# Patient Record
Sex: Female | Born: 1935
Health system: Southern US, Community
[De-identification: ages and names within clinical notes are randomized; demographics above are authoritative.]

## PROBLEM LIST (undated history)

## (undated) DIAGNOSIS — K219 Gastro-esophageal reflux disease without esophagitis: Secondary | ICD-10-CM

## (undated) DIAGNOSIS — C50919 Malignant neoplasm of unspecified site of unspecified female breast: Secondary | ICD-10-CM

## (undated) DIAGNOSIS — M2550 Pain in unspecified joint: Secondary | ICD-10-CM

## (undated) DIAGNOSIS — R61 Generalized hyperhidrosis: Secondary | ICD-10-CM

## (undated) DIAGNOSIS — E049 Nontoxic goiter, unspecified: Secondary | ICD-10-CM

## (undated) DIAGNOSIS — M810 Age-related osteoporosis without current pathological fracture: Secondary | ICD-10-CM

## (undated) DIAGNOSIS — M1990 Unspecified osteoarthritis, unspecified site: Secondary | ICD-10-CM

## (undated) DIAGNOSIS — R238 Other skin changes: Secondary | ICD-10-CM

## (undated) DIAGNOSIS — B029 Zoster without complications: Secondary | ICD-10-CM

## (undated) DIAGNOSIS — C801 Malignant (primary) neoplasm, unspecified: Secondary | ICD-10-CM

## (undated) DIAGNOSIS — C539 Malignant neoplasm of cervix uteri, unspecified: Secondary | ICD-10-CM

## (undated) DIAGNOSIS — R635 Abnormal weight gain: Secondary | ICD-10-CM

## (undated) DIAGNOSIS — M797 Fibromyalgia: Secondary | ICD-10-CM

## (undated) HISTORY — PX: SPINE SURGERY: SHX786

## (undated) HISTORY — DX: Abnormal weight gain: R63.5

## (undated) HISTORY — DX: Malignant neoplasm of cervix uteri, unspecified: C53.9

## (undated) HISTORY — DX: Zoster without complications: B02.9

## (undated) HISTORY — DX: Age-related osteoporosis without current pathological fracture: M81.0

## (undated) HISTORY — PX: CERVICAL CONE BIOPSY: SUR198

## (undated) HISTORY — DX: Malignant neoplasm of unspecified site of unspecified female breast: C50.919

## (undated) HISTORY — DX: Other skin changes: R23.8

## (undated) HISTORY — DX: Gastro-esophageal reflux disease without esophagitis: K21.9

## (undated) HISTORY — DX: Pain in unspecified joint: M25.50

## (undated) HISTORY — DX: Generalized hyperhidrosis: R61

## (undated) HISTORY — DX: Unspecified osteoarthritis, unspecified site: M19.90

## (undated) HISTORY — DX: Nontoxic goiter, unspecified: E04.9

## (undated) HISTORY — PX: UTERINE SUSPENSION: SUR1430

## (undated) HISTORY — DX: Fibromyalgia: M79.7

## (undated) HISTORY — DX: Malignant (primary) neoplasm, unspecified: C80.1

---

## 1998-03-26 ENCOUNTER — Ambulatory Visit (HOSPITAL_COMMUNITY): Admission: RE | Admit: 1998-03-26 | Discharge: 1998-03-27 | Payer: Self-pay | Admitting: Neurosurgery

## 1998-06-17 ENCOUNTER — Ambulatory Visit (HOSPITAL_COMMUNITY): Admission: RE | Admit: 1998-06-17 | Discharge: 1998-06-17 | Payer: Self-pay | Admitting: Urology

## 1998-09-13 ENCOUNTER — Other Ambulatory Visit: Admission: RE | Admit: 1998-09-13 | Discharge: 1998-09-13 | Payer: Self-pay | Admitting: Obstetrics and Gynecology

## 1998-10-21 ENCOUNTER — Ambulatory Visit (HOSPITAL_COMMUNITY): Admission: RE | Admit: 1998-10-21 | Discharge: 1998-10-21 | Payer: Self-pay | Admitting: Neurosurgery

## 1998-10-21 ENCOUNTER — Encounter: Payer: Self-pay | Admitting: Neurosurgery

## 1999-09-14 ENCOUNTER — Other Ambulatory Visit: Admission: RE | Admit: 1999-09-14 | Discharge: 1999-09-14 | Payer: Self-pay | Admitting: Obstetrics and Gynecology

## 2000-10-15 ENCOUNTER — Other Ambulatory Visit: Admission: RE | Admit: 2000-10-15 | Discharge: 2000-10-15 | Payer: Self-pay | Admitting: Obstetrics and Gynecology

## 2000-12-31 ENCOUNTER — Ambulatory Visit (HOSPITAL_COMMUNITY): Admission: RE | Admit: 2000-12-31 | Discharge: 2000-12-31 | Payer: Self-pay

## 2001-12-10 ENCOUNTER — Other Ambulatory Visit: Admission: RE | Admit: 2001-12-10 | Discharge: 2001-12-10 | Payer: Self-pay | Admitting: *Deleted

## 2002-12-23 ENCOUNTER — Other Ambulatory Visit: Admission: RE | Admit: 2002-12-23 | Discharge: 2002-12-23 | Payer: Self-pay | Admitting: *Deleted

## 2003-12-31 ENCOUNTER — Other Ambulatory Visit: Admission: RE | Admit: 2003-12-31 | Discharge: 2003-12-31 | Payer: Self-pay | Admitting: *Deleted

## 2004-06-23 ENCOUNTER — Ambulatory Visit (HOSPITAL_COMMUNITY): Admission: RE | Admit: 2004-06-23 | Discharge: 2004-06-23 | Payer: Self-pay | Admitting: Cardiology

## 2005-02-28 ENCOUNTER — Encounter: Admission: RE | Admit: 2005-02-28 | Discharge: 2005-02-28 | Payer: Self-pay | Admitting: Cardiology

## 2005-03-08 ENCOUNTER — Encounter: Payer: Self-pay | Admitting: Cardiology

## 2005-04-19 ENCOUNTER — Encounter: Admission: RE | Admit: 2005-04-19 | Discharge: 2005-04-19 | Payer: Self-pay

## 2006-03-13 ENCOUNTER — Ambulatory Visit: Payer: Self-pay | Admitting: *Deleted

## 2006-07-11 ENCOUNTER — Ambulatory Visit: Payer: Self-pay | Admitting: Internal Medicine

## 2008-02-11 ENCOUNTER — Encounter: Admission: RE | Admit: 2008-02-11 | Discharge: 2008-02-11 | Payer: Self-pay | Admitting: Obstetrics and Gynecology

## 2010-02-16 ENCOUNTER — Emergency Department (HOSPITAL_COMMUNITY): Admission: AC | Admit: 2010-02-16 | Discharge: 2010-02-16 | Payer: Self-pay

## 2010-03-03 ENCOUNTER — Encounter: Admission: RE | Admit: 2010-03-03 | Discharge: 2010-03-03 | Payer: Self-pay | Admitting: Orthopedic Surgery

## 2010-03-20 HISTORY — PX: LEG SURGERY: SHX1003

## 2010-03-25 ENCOUNTER — Encounter: Payer: Self-pay | Admitting: Internal Medicine

## 2010-04-05 ENCOUNTER — Encounter: Admission: RE | Admit: 2010-04-05 | Discharge: 2010-04-05 | Payer: Self-pay | Admitting: Gastroenterology

## 2010-04-14 ENCOUNTER — Ambulatory Visit (HOSPITAL_BASED_OUTPATIENT_CLINIC_OR_DEPARTMENT_OTHER): Admission: RE | Admit: 2010-04-14 | Discharge: 2010-04-15 | Payer: Self-pay | Admitting: Orthopedic Surgery

## 2010-07-19 ENCOUNTER — Encounter: Admission: RE | Admit: 2010-07-19 | Discharge: 2010-07-19 | Payer: Self-pay | Admitting: Diagnostic Neuroimaging

## 2010-08-30 ENCOUNTER — Encounter
Admission: RE | Admit: 2010-08-30 | Discharge: 2010-11-15 | Payer: Self-pay | Source: Home / Self Care | Attending: Diagnostic Neuroimaging | Admitting: Diagnostic Neuroimaging

## 2010-11-15 ENCOUNTER — Encounter
Admission: RE | Admit: 2010-11-15 | Discharge: 2010-11-15 | Payer: Self-pay | Source: Home / Self Care | Attending: Diagnostic Neuroimaging | Admitting: Diagnostic Neuroimaging

## 2010-12-22 NOTE — Letter (Signed)
Summary: [facility] Office Note  [facility] Office Note   Imported By: Roderic Ovens 04/19/2010 15:46:20  _____________________________________________________________________  External Attachment:    Type:   Image     Comment:   External Document

## 2011-01-11 ENCOUNTER — Other Ambulatory Visit: Payer: Self-pay | Admitting: Obstetrics and Gynecology

## 2011-01-11 DIAGNOSIS — Z1231 Encounter for screening mammogram for malignant neoplasm of breast: Secondary | ICD-10-CM

## 2011-01-19 ENCOUNTER — Ambulatory Visit: Payer: Self-pay

## 2011-01-24 ENCOUNTER — Ambulatory Visit
Admission: RE | Admit: 2011-01-24 | Discharge: 2011-01-24 | Disposition: A | Discharge status: Home / Self Care | Payer: Medicare Other | Source: Ambulatory Visit | Attending: Obstetrics and Gynecology | Admitting: Obstetrics and Gynecology

## 2011-01-24 DIAGNOSIS — Z1231 Encounter for screening mammogram for malignant neoplasm of breast: Secondary | ICD-10-CM

## 2011-01-27 ENCOUNTER — Other Ambulatory Visit: Payer: Self-pay | Admitting: Obstetrics and Gynecology

## 2011-01-27 DIAGNOSIS — R928 Other abnormal and inconclusive findings on diagnostic imaging of breast: Secondary | ICD-10-CM

## 2011-02-06 LAB — POCT HEMOGLOBIN-HEMACUE: Hemoglobin: 14.2 g/dL (ref 12.0–15.0)

## 2011-02-07 ENCOUNTER — Other Ambulatory Visit: Payer: Self-pay | Admitting: Obstetrics and Gynecology

## 2011-02-07 ENCOUNTER — Ambulatory Visit
Admission: RE | Admit: 2011-02-07 | Discharge: 2011-02-07 | Disposition: A | Discharge status: Home / Self Care | Payer: Medicare Other | Source: Ambulatory Visit | Attending: Obstetrics and Gynecology | Admitting: Obstetrics and Gynecology

## 2011-02-07 ENCOUNTER — Other Ambulatory Visit: Payer: Self-pay | Admitting: Diagnostic Radiology

## 2011-02-07 DIAGNOSIS — R928 Other abnormal and inconclusive findings on diagnostic imaging of breast: Secondary | ICD-10-CM

## 2011-02-08 ENCOUNTER — Other Ambulatory Visit: Payer: Self-pay | Admitting: Obstetrics and Gynecology

## 2011-02-08 ENCOUNTER — Ambulatory Visit
Admission: RE | Admit: 2011-02-08 | Discharge: 2011-02-08 | Disposition: A | Discharge status: Home / Self Care | Payer: Medicare Other | Source: Ambulatory Visit | Attending: Obstetrics and Gynecology | Admitting: Obstetrics and Gynecology

## 2011-02-08 DIAGNOSIS — C50912 Malignant neoplasm of unspecified site of left female breast: Secondary | ICD-10-CM

## 2011-02-08 DIAGNOSIS — R928 Other abnormal and inconclusive findings on diagnostic imaging of breast: Secondary | ICD-10-CM

## 2011-02-10 DIAGNOSIS — K219 Gastro-esophageal reflux disease without esophagitis: Secondary | ICD-10-CM

## 2011-02-10 DIAGNOSIS — R238 Other skin changes: Secondary | ICD-10-CM

## 2011-02-10 DIAGNOSIS — C50919 Malignant neoplasm of unspecified site of unspecified female breast: Secondary | ICD-10-CM

## 2011-02-10 DIAGNOSIS — R635 Abnormal weight gain: Secondary | ICD-10-CM

## 2011-02-10 DIAGNOSIS — R233 Spontaneous ecchymoses: Secondary | ICD-10-CM

## 2011-02-10 DIAGNOSIS — M2550 Pain in unspecified joint: Secondary | ICD-10-CM

## 2011-02-10 DIAGNOSIS — C539 Malignant neoplasm of cervix uteri, unspecified: Secondary | ICD-10-CM

## 2011-02-10 DIAGNOSIS — M797 Fibromyalgia: Secondary | ICD-10-CM

## 2011-02-10 DIAGNOSIS — R61 Generalized hyperhidrosis: Secondary | ICD-10-CM

## 2011-02-10 HISTORY — DX: Generalized hyperhidrosis: R61

## 2011-02-10 HISTORY — DX: Malignant neoplasm of cervix uteri, unspecified: C53.9

## 2011-02-10 HISTORY — DX: Abnormal weight gain: R63.5

## 2011-02-10 HISTORY — DX: Spontaneous ecchymoses: R23.3

## 2011-02-10 HISTORY — DX: Fibromyalgia: M79.7

## 2011-02-10 HISTORY — DX: Other skin changes: R23.8

## 2011-02-10 HISTORY — DX: Pain in unspecified joint: M25.50

## 2011-02-10 HISTORY — DX: Malignant neoplasm of unspecified site of unspecified female breast: C50.919

## 2011-02-10 HISTORY — DX: Gastro-esophageal reflux disease without esophagitis: K21.9

## 2011-02-13 ENCOUNTER — Other Ambulatory Visit: Payer: Medicare Other

## 2011-02-13 ENCOUNTER — Ambulatory Visit
Admission: RE | Admit: 2011-02-13 | Discharge: 2011-02-13 | Disposition: A | Discharge status: Home / Self Care | Payer: Medicare Other | Source: Ambulatory Visit | Attending: Obstetrics and Gynecology | Admitting: Obstetrics and Gynecology

## 2011-02-13 DIAGNOSIS — C50912 Malignant neoplasm of unspecified site of left female breast: Secondary | ICD-10-CM

## 2011-02-13 LAB — GLUCOSE, CAPILLARY: Glucose-Capillary: 97 mg/dL (ref 70–99)

## 2011-02-13 MED ORDER — GADOBENATE DIMEGLUMINE 529 MG/ML IV SOLN
12.0000 mL | Freq: Once | INTRAVENOUS | Status: AC | PRN
  Administered 2011-02-13: 12 mL via INTRAVENOUS

## 2011-02-17 ENCOUNTER — Other Ambulatory Visit: Payer: Self-pay | Admitting: General Surgery

## 2011-02-17 DIAGNOSIS — D0510 Intraductal carcinoma in situ of unspecified breast: Secondary | ICD-10-CM

## 2011-02-19 HISTORY — PX: BREAST LUMPECTOMY: SHX2

## 2011-03-02 ENCOUNTER — Other Ambulatory Visit: Payer: Self-pay | Admitting: General Surgery

## 2011-03-02 ENCOUNTER — Ambulatory Visit
Admission: RE | Admit: 2011-03-02 | Discharge: 2011-03-02 | Disposition: A | Discharge status: Home / Self Care | Payer: Medicare Other | Source: Ambulatory Visit | Attending: General Surgery | Admitting: General Surgery

## 2011-03-02 ENCOUNTER — Ambulatory Visit (HOSPITAL_BASED_OUTPATIENT_CLINIC_OR_DEPARTMENT_OTHER)
Admission: RE | Admit: 2011-03-02 | Discharge: 2011-03-03 | Disposition: A | Discharge status: Home / Self Care | Payer: Medicare Other | Source: Ambulatory Visit | Attending: General Surgery | Admitting: General Surgery

## 2011-03-02 DIAGNOSIS — D0590 Unspecified type of carcinoma in situ of unspecified breast: Secondary | ICD-10-CM | POA: Insufficient documentation

## 2011-03-02 DIAGNOSIS — Z7982 Long term (current) use of aspirin: Secondary | ICD-10-CM | POA: Insufficient documentation

## 2011-03-02 DIAGNOSIS — D0510 Intraductal carcinoma in situ of unspecified breast: Secondary | ICD-10-CM

## 2011-03-02 DIAGNOSIS — Z01812 Encounter for preprocedural laboratory examination: Secondary | ICD-10-CM | POA: Insufficient documentation

## 2011-03-02 DIAGNOSIS — H409 Unspecified glaucoma: Secondary | ICD-10-CM | POA: Insufficient documentation

## 2011-03-02 LAB — POCT HEMOGLOBIN-HEMACUE: Hemoglobin: 15.3 g/dL — ABNORMAL HIGH (ref 12.0–15.0)

## 2011-03-13 NOTE — Op Note (Signed)
  NAMEZARYAH, SECKEL             ACCOUNT NO.:  [id number]  MEDICAL RECORD NO.:  [id number]           PATIENT TYPE:  LOCATION:                                 FACILITY:  PHYSICIAN:  Sharlet Salina T. 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  SURGICAL PROCEDURE:  Needle-localized left breast lumpectomy.  SURGEON:  Lorne Skeens. Lan Mcneill, MD  ANESTHESIA:  General.  BRIEF HISTORY:  Krissy Orebaugh is a 77 year old female who on a recent screening mammogram was found to have a small cluster of pleomorphic calcifications in the upper outer quadrant of the left breast.  Large core needle biopsy has revealed a grade 2 ductal carcinoma in situ.  MRI has shown only post biopsy change in the area.  After discussion of treatment options, we elected to proceed with needle-localized left breast lumpectomy.  The nature of the procedure, indications, risks of bleeding, infection, anesthetic complications, possible need for further surgery and additional treatments were discussed and understood.  She is now brought to the operating room for this procedure.  DESCRIPTION OF OPERATION:  The patient was brought to the operating room and placed in supine position on the operating table and laryngeal mask general anesthesia was induced.  The patient undergone an accurate needle localization at the breast center preoperatively.  She received preoperative IV antibiotics.  The breast was widely and sterilely prepped and draped and correct patient and procedure verified.  A curvilinear incision was made just beneath the wire insertion site at the site of the previous core biopsy.  Dissection was carried down through subcutaneous tissue  sharply and with cautery.  A short skin and subcutaneous flap was raised superiorly and the wire was brought into the wound.  There was about 2.5 cm area of discrete induration around the shaft of the wire at the previous biopsy site.  This entire area was completely excised with cautery around the shaft and the tip of the wire and the specimen removed.  Specimen mammography was obtained showing the clip in the wire within the specimen.  The clip appeared to be relatively somewhat close to the inferior margin and I did re-excise the inferior margin for about half a centimeter and this was oriented and sent for permanent pathology.  The wound was irrigated and complete hemostasis obtained with cautery.  The soft tissue was infiltrated with Marcaine with epinephrine.  The breast capsule was reapproximated with interrupted 3-0 Vicryl and the skin closed with subcuticular 5-0 Monocryl and Dermabond.  Sponge and needle counts were correct.  The patient was taken to the recovery room in good condition.     Lorne Skeens. Tiffanyann Deroo, M.D.     Tory Emerald  D:  03/02/2011  T:  03/03/2011  Job:  [id number]  Electronically Signed by Glenna Fellows M.D. on 03/13/2011 10:02:00 AM

## 2011-03-22 ENCOUNTER — Ambulatory Visit: Payer: Medicare Other | Attending: Diagnostic Neuroimaging | Admitting: *Deleted

## 2011-03-22 DIAGNOSIS — M6281 Muscle weakness (generalized): Secondary | ICD-10-CM | POA: Insufficient documentation

## 2011-03-22 DIAGNOSIS — M545 Low back pain, unspecified: Secondary | ICD-10-CM | POA: Insufficient documentation

## 2011-03-22 DIAGNOSIS — IMO0001 Reserved for inherently not codable concepts without codable children: Secondary | ICD-10-CM | POA: Insufficient documentation

## 2011-03-22 DIAGNOSIS — M542 Cervicalgia: Secondary | ICD-10-CM | POA: Insufficient documentation

## 2011-03-28 ENCOUNTER — Encounter: Payer: Medicare Other | Admitting: Oncology

## 2011-04-13 ENCOUNTER — Other Ambulatory Visit: Payer: Self-pay | Admitting: Oncology

## 2011-04-13 ENCOUNTER — Encounter (HOSPITAL_BASED_OUTPATIENT_CLINIC_OR_DEPARTMENT_OTHER): Payer: Medicare Other | Admitting: Oncology

## 2011-04-13 DIAGNOSIS — Z170 Estrogen receptor positive status [ER+]: Secondary | ICD-10-CM

## 2011-04-13 DIAGNOSIS — D0590 Unspecified type of carcinoma in situ of unspecified breast: Secondary | ICD-10-CM

## 2011-04-13 LAB — COMPREHENSIVE METABOLIC PANEL
ALT: 25 U/L (ref 0–35)
AST: 24 U/L (ref 0–37)
BUN: 18 mg/dL (ref 6–23)
Calcium: 9.3 mg/dL (ref 8.4–10.5)
Chloride: 102 mEq/L (ref 96–112)
Creatinine, Ser: 0.84 mg/dL (ref 0.40–1.20)
Total Bilirubin: 0.4 mg/dL (ref 0.3–1.2)

## 2011-04-13 LAB — CBC WITH DIFFERENTIAL/PLATELET
BASO%: 0.3 % (ref 0.0–2.0)
Basophils Absolute: 0 10*3/uL (ref 0.0–0.1)
EOS%: 1.5 % (ref 0.0–7.0)
HCT: 39.5 % (ref 34.8–46.6)
HGB: 13.6 g/dL (ref 11.6–15.9)
LYMPH%: 25.3 % (ref 14.0–49.7)
MCH: 32 pg (ref 25.1–34.0)
MCHC: 34.4 g/dL (ref 31.5–36.0)
MCV: 92.9 fL (ref 79.5–101.0)
MONO%: 7.4 % (ref 0.0–14.0)
NEUT%: 65.5 % (ref 38.4–76.8)
Platelets: 272 10*3/uL (ref 145–400)
lymph#: 1.9 10*3/uL (ref 0.9–3.3)

## 2011-04-19 ENCOUNTER — Ambulatory Visit: Payer: Medicare Other | Admitting: Radiation Oncology

## 2011-05-11 ENCOUNTER — Encounter (INDEPENDENT_AMBULATORY_CARE_PROVIDER_SITE_OTHER): Payer: Self-pay | Admitting: General Surgery

## 2011-06-13 ENCOUNTER — Encounter (HOSPITAL_BASED_OUTPATIENT_CLINIC_OR_DEPARTMENT_OTHER): Payer: Medicare Other | Admitting: Oncology

## 2011-06-13 DIAGNOSIS — D0590 Unspecified type of carcinoma in situ of unspecified breast: Secondary | ICD-10-CM

## 2011-06-13 DIAGNOSIS — Z170 Estrogen receptor positive status [ER+]: Secondary | ICD-10-CM

## 2011-07-27 ENCOUNTER — Other Ambulatory Visit: Payer: Self-pay | Admitting: Internal Medicine

## 2011-07-27 DIAGNOSIS — Z780 Asymptomatic menopausal state: Secondary | ICD-10-CM

## 2011-08-01 ENCOUNTER — Other Ambulatory Visit: Payer: Medicare Other

## 2011-10-25 ENCOUNTER — Telehealth: Payer: Self-pay | Admitting: Oncology

## 2011-10-25 NOTE — Telephone Encounter (Signed)
per pof 07/24 called pts home lmovm wit appt for march 2013 and to rtn call to confirm appts

## 2011-10-26 ENCOUNTER — Telehealth: Payer: Self-pay | Admitting: Oncology

## 2011-10-26 NOTE — Telephone Encounter (Signed)
pt called and confrimed appts for [phone]

## 2011-12-13 ENCOUNTER — Ambulatory Visit (INDEPENDENT_AMBULATORY_CARE_PROVIDER_SITE_OTHER): Payer: Medicare Other | Admitting: General Surgery

## 2011-12-13 ENCOUNTER — Encounter (INDEPENDENT_AMBULATORY_CARE_PROVIDER_SITE_OTHER): Payer: Self-pay | Admitting: General Surgery

## 2011-12-13 VITALS — BP 152/72 | HR 76 | Temp 97.2°F | Resp 12 | Ht 62.5 in | Wt 126.2 lb

## 2011-12-13 DIAGNOSIS — C50919 Malignant neoplasm of unspecified site of unspecified female breast: Secondary | ICD-10-CM

## 2011-12-13 NOTE — Progress Notes (Signed)
Chief complaint: Followup breast cancer  History: Patient returns for followup with history of left breast lumpectomy for a small low-grade ER positive ductal carcinoma in situ. She had at least 5 mm margins and elected observation only without radiation or hormones. She has not noted any change in her breast or other worrisome symptoms.  Exam: BP 152/72  Pulse 76  Temp(Src) 97.2 F (36.2 C) (Temporal)  Resp 12  Ht 5' 2.5" (1.588 m)  Wt 126 lb 3.2 oz (57.244 kg)  BMI 22.71 kg/m2  Gen.: Healthy-appearing female appearing younger than her stated age Lymph nodes: No palpable cervical, supraclavicular or axillary nodes Breasts: Well-healed incision upper left breast. No thickening of the lumpectomy site. No other masses or skin changes in either breast.  Imaging: Mammogram due in March of this year  Assessment and plan: Doing well with no clinical evidence of recurrent or new breast cancer. After to return in 6 months.

## 2012-02-05 ENCOUNTER — Other Ambulatory Visit: Payer: Medicare Other

## 2012-02-06 ENCOUNTER — Other Ambulatory Visit (HOSPITAL_BASED_OUTPATIENT_CLINIC_OR_DEPARTMENT_OTHER): Payer: Medicare Other | Admitting: Lab

## 2012-02-06 DIAGNOSIS — Z170 Estrogen receptor positive status [ER+]: Secondary | ICD-10-CM

## 2012-02-06 DIAGNOSIS — D0590 Unspecified type of carcinoma in situ of unspecified breast: Secondary | ICD-10-CM

## 2012-02-06 LAB — CBC WITH DIFFERENTIAL/PLATELET
Basophils Absolute: 0 10*3/uL (ref 0.0–0.1)
EOS%: 2.1 % (ref 0.0–7.0)
HCT: 40.4 % (ref 34.8–46.6)
HGB: 13.6 g/dL (ref 11.6–15.9)
LYMPH%: 33.2 % (ref 14.0–49.7)
MCH: 30.4 pg (ref 25.1–34.0)
MCHC: 33.7 g/dL (ref 31.5–36.0)
MONO#: 0.5 10*3/uL (ref 0.1–0.9)
NEUT%: 56 % (ref 38.4–76.8)
Platelets: 270 10*3/uL (ref 145–400)
lymph#: 2.1 10*3/uL (ref 0.9–3.3)

## 2012-02-06 LAB — COMPREHENSIVE METABOLIC PANEL
BUN: 15 mg/dL (ref 6–23)
CO2: 26 mEq/L (ref 19–32)
Calcium: 9.5 mg/dL (ref 8.4–10.5)
Chloride: 105 mEq/L (ref 96–112)
Creatinine, Ser: 0.8 mg/dL (ref 0.50–1.10)
Total Bilirubin: 0.6 mg/dL (ref 0.3–1.2)

## 2012-02-06 LAB — CANCER ANTIGEN 27.29: CA 27.29: 18 U/mL (ref 0–39)

## 2012-02-12 ENCOUNTER — Ambulatory Visit: Payer: Medicare Other | Admitting: Oncology

## 2012-02-13 ENCOUNTER — Telehealth: Payer: Self-pay | Admitting: Oncology

## 2012-02-13 ENCOUNTER — Ambulatory Visit (HOSPITAL_BASED_OUTPATIENT_CLINIC_OR_DEPARTMENT_OTHER): Payer: Medicare Other | Admitting: Oncology

## 2012-02-13 VITALS — BP 147/76 | HR 86 | Temp 97.5°F | Ht 62.5 in | Wt 129.7 lb

## 2012-02-13 DIAGNOSIS — Z170 Estrogen receptor positive status [ER+]: Secondary | ICD-10-CM

## 2012-02-13 DIAGNOSIS — D0590 Unspecified type of carcinoma in situ of unspecified breast: Secondary | ICD-10-CM

## 2012-02-13 DIAGNOSIS — C50919 Malignant neoplasm of unspecified site of unspecified female breast: Secondary | ICD-10-CM

## 2012-02-13 DIAGNOSIS — M79609 Pain in unspecified limb: Secondary | ICD-10-CM

## 2012-02-13 DIAGNOSIS — M542 Cervicalgia: Secondary | ICD-10-CM
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
## 2012-02-13 NOTE — Telephone Encounter (Signed)
gve the pt her April 2013 appt calendar. Per the pt's request she will schedule her own mammogram and appt with dr Lorin Picket [email or web address]

## 2012-10-22 ENCOUNTER — Other Ambulatory Visit: Payer: Self-pay | Admitting: Internal Medicine

## 2012-10-22 DIAGNOSIS — R1011 Right upper quadrant pain: Secondary | ICD-10-CM

## 2012-10-29 ENCOUNTER — Ambulatory Visit
Admission: RE | Admit: 2012-10-29 | Discharge: 2012-10-29 | Disposition: A | Discharge status: Home / Self Care | Payer: Medicare Other | Source: Ambulatory Visit | Attending: Internal Medicine | Admitting: Internal Medicine

## 2012-10-29 DIAGNOSIS — R1011 Right upper quadrant pain: Secondary | ICD-10-CM

## 2012-11-20 DIAGNOSIS — E049 Nontoxic goiter, unspecified: Secondary | ICD-10-CM

## 2012-11-20 HISTORY — DX: Nontoxic goiter, unspecified: E04.9

## 2013-01-29 ENCOUNTER — Other Ambulatory Visit: Payer: Self-pay | Admitting: Oncology

## 2013-01-29 DIAGNOSIS — Z853 Personal history of malignant neoplasm of breast: Secondary | ICD-10-CM

## 2013-02-17 ENCOUNTER — Ambulatory Visit: Payer: Medicare Other | Admitting: Oncology

## 2013-02-19 ENCOUNTER — Ambulatory Visit
Admission: RE | Admit: 2013-02-19 | Discharge: 2013-02-19 | Disposition: A | Discharge status: Home / Self Care | Payer: Medicare Other | Source: Ambulatory Visit | Attending: Oncology | Admitting: Oncology

## 2013-02-19 ENCOUNTER — Other Ambulatory Visit: Payer: Self-pay | Admitting: Oncology

## 2013-02-19 DIAGNOSIS — Z853 Personal history of malignant neoplasm of breast: Secondary | ICD-10-CM

## 2013-03-05 ENCOUNTER — Other Ambulatory Visit: Payer: Self-pay | Admitting: *Deleted

## 2013-03-05 DIAGNOSIS — C50911 Malignant neoplasm of unspecified site of right female breast: Secondary | ICD-10-CM

## 2013-03-06 ENCOUNTER — Telehealth: Payer: Self-pay | Admitting: *Deleted

## 2013-03-06 ENCOUNTER — Other Ambulatory Visit (HOSPITAL_BASED_OUTPATIENT_CLINIC_OR_DEPARTMENT_OTHER): Payer: Medicare Other | Admitting: Lab

## 2013-03-06 ENCOUNTER — Ambulatory Visit (HOSPITAL_BASED_OUTPATIENT_CLINIC_OR_DEPARTMENT_OTHER): Payer: Medicare Other | Admitting: Oncology

## 2013-03-06 VITALS — BP 138/71 | HR 82 | Temp 97.8°F | Resp 20 | Ht 62.0 in | Wt 124.2 lb

## 2013-03-06 DIAGNOSIS — C50911 Malignant neoplasm of unspecified site of right female breast: Secondary | ICD-10-CM

## 2013-03-06 DIAGNOSIS — C50919 Malignant neoplasm of unspecified site of unspecified female breast: Secondary | ICD-10-CM

## 2013-03-06 DIAGNOSIS — Z853 Personal history of malignant neoplasm of breast: Secondary | ICD-10-CM

## 2013-03-06 LAB — COMPREHENSIVE METABOLIC PANEL (CC13)
AST: 19 U/L (ref 5–34)
Albumin: 3.4 g/dL — ABNORMAL LOW (ref 3.5–5.0)
BUN: 15.3 mg/dL (ref 7.0–26.0)
CO2: 27 mEq/L (ref 22–29)
Calcium: 9.4 mg/dL (ref 8.4–10.4)
Chloride: 106 mEq/L (ref 98–107)
Glucose: 113 mg/dl — ABNORMAL HIGH (ref 70–99)
Potassium: 4.6 mEq/L (ref 3.5–5.1)

## 2013-03-06 LAB — CBC WITH DIFFERENTIAL/PLATELET
Basophils Absolute: 0 10*3/uL (ref 0.0–0.1)
EOS%: 1.7 % (ref 0.0–7.0)
Eosinophils Absolute: 0.1 10*3/uL (ref 0.0–0.5)
HCT: 42.9 % (ref 34.8–46.6)
HGB: 14.3 g/dL (ref 11.6–15.9)
MONO#: 0.6 10*3/uL (ref 0.1–0.9)
NEUT#: 4.6 10*3/uL (ref 1.5–6.5)
RDW: 13.9 % (ref 11.2–14.5)
WBC: 7.5 10*3/uL (ref 3.9–10.3)
lymph#: 2.2 10*3/uL (ref 0.9–3.3)
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
## 2013-03-06 NOTE — Telephone Encounter (Signed)
Lm gv appt d/t for 04/16/14..made pt aware that i will mail a cal as a reminder....td

## 2013-04-18 ENCOUNTER — Other Ambulatory Visit: Payer: Self-pay | Admitting: Internal Medicine

## 2013-04-18 ENCOUNTER — Ambulatory Visit
Admission: RE | Admit: 2013-04-18 | Discharge: 2013-04-18 | Disposition: A | Discharge status: Home / Self Care | Payer: Medicare Other | Source: Ambulatory Visit | Attending: Internal Medicine | Admitting: Internal Medicine

## 2013-04-18 DIAGNOSIS — R221 Localized swelling, mass and lump, neck: Secondary | ICD-10-CM

## 2013-04-23 ENCOUNTER — Other Ambulatory Visit: Payer: Self-pay | Admitting: Internal Medicine

## 2013-04-23 DIAGNOSIS — E041 Nontoxic single thyroid nodule: Secondary | ICD-10-CM

## 2013-04-24 ENCOUNTER — Telehealth: Payer: Self-pay | Admitting: *Deleted

## 2013-05-21 ENCOUNTER — Inpatient Hospital Stay
Admission: RE | Admit: 2013-05-21 | Discharge: 2013-05-21 | Disposition: A | Discharge status: Home / Self Care | Payer: Medicare Other | Source: Ambulatory Visit | Attending: Internal Medicine | Admitting: Internal Medicine

## 2013-05-29 ENCOUNTER — Telehealth (INDEPENDENT_AMBULATORY_CARE_PROVIDER_SITE_OTHER): Payer: Self-pay | Admitting: *Deleted

## 2013-05-29 NOTE — Telephone Encounter (Signed)
Patient called in today with multiple questions. Patient is set up for a LTF appt on 06/19/13 however patient also wants to speak with Dr. Johna Sheriff regarding possibly doing surgery on an area on her leg and also wants to speak with him about a mass on her thyroid.  Patients biggest concern is the mass on the thyroid.  Patient has already had an US Thyroid and they are wanting her to have a needle biopsy done but she wanted to check with Dr. Johna Sheriff first to see what he would want her to have.  Patient was asking about having a MRI of it as well.  Also wondering if patient needs to be moved to a longer appt time due to so many concerns.

## 2013-05-30 ENCOUNTER — Telehealth (INDEPENDENT_AMBULATORY_CARE_PROVIDER_SITE_OTHER): Payer: Self-pay | Admitting: *Deleted

## 2013-05-30 NOTE — Telephone Encounter (Signed)
Message copied by Consuelo Pandy on Fri May 30, 2013  8:50 AM ------      Message from: Glenna Fellows T      Created: Fri May 30, 2013  7:45 AM       Please call pt and tell her needle Bx would be standard in this situation and MRI rarely would be needed.  I can see her in the office first if she prefers.  Please get her a longer appt. ------

## 2013-05-30 NOTE — Telephone Encounter (Signed)
Left message for patient to call back to discuss below message.  Patient has been moved to 7/31 (same day) @ 915a to give more time for the appt.

## 2013-05-30 NOTE — Telephone Encounter (Signed)
Patient called back and discussed the below.  Patient states understanding and states she will go ahead with scheduling needle Bx so hopefully the results would be back for Dr. Johna Sheriff to review.  Patient agreeable with new appt time.

## 2013-06-03 ENCOUNTER — Ambulatory Visit
Admission: RE | Admit: 2013-06-03 | Discharge: 2013-06-03 | Disposition: A | Discharge status: Home / Self Care | Payer: Medicare Other | Source: Ambulatory Visit | Attending: Internal Medicine | Admitting: Internal Medicine

## 2013-06-03 ENCOUNTER — Other Ambulatory Visit (HOSPITAL_COMMUNITY)
Admission: RE | Admit: 2013-06-03 | Discharge: 2013-06-03 | Disposition: A | Discharge status: Home / Self Care | Payer: Medicare Other | Source: Ambulatory Visit | Attending: Interventional Radiology | Admitting: Interventional Radiology

## 2013-06-03 DIAGNOSIS — E041 Nontoxic single thyroid nodule: Secondary | ICD-10-CM | POA: Insufficient documentation

## 2013-06-19 ENCOUNTER — Ambulatory Visit (INDEPENDENT_AMBULATORY_CARE_PROVIDER_SITE_OTHER): Payer: Medicare Other | Admitting: General Surgery

## 2013-06-19 ENCOUNTER — Encounter (INDEPENDENT_AMBULATORY_CARE_PROVIDER_SITE_OTHER): Payer: Self-pay | Admitting: General Surgery

## 2013-06-19 VITALS — BP 130/78 | HR 68 | Temp 97.3°F | Resp 16 | Ht 62.5 in | Wt 125.8 lb

## 2013-06-19 DIAGNOSIS — M25569 Pain in unspecified knee: Secondary | ICD-10-CM | POA: Insufficient documentation

## 2013-06-19 DIAGNOSIS — C50919 Malignant neoplasm of unspecified site of unspecified female breast: Secondary | ICD-10-CM

## 2013-06-19 DIAGNOSIS — M25562 Pain in left knee: Secondary | ICD-10-CM

## 2013-06-19 DIAGNOSIS — E041 Nontoxic single thyroid nodule: Secondary | ICD-10-CM

## 2013-06-19 DIAGNOSIS — C50912 Malignant neoplasm of unspecified site of left female breast: Secondary | ICD-10-CM

## 2013-06-19 NOTE — Progress Notes (Signed)
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 She has a history of left breast lumpectomy for a small area of low-grade DCIS on observation only with no radiation or hormonal treatment. She is here for routine followup for this problem. She has a little bit of breast discomfort but no other symptoms such as lumbar nipple discharge. Mammogram this spring showed a small resolving seroma but otherwise negative.  She recently noted a lump in the right side of her neck. This was in the spring. It has been stable since that time. It may cause a little bit of pressure but probably not symptomatic. She has no previous history of thyroid disease or head and neck radiation. Thyroid ultrasound was obtained with results as below: THYROID ULTRASOUND  Technique: Ultrasound examination of the thyroid gland and adjacent  soft tissues was performed.  Comparison: None.  Findings:  Right thyroid lobe: 28 x 30 x 43 mm, mildly inhomogeneous  background echotexture  Left thyroid lobe: 10 x 11 x 42 mm  Isthmus: 2.1 mm in thickness  Focal nodules: 24 x 27 x 34 mm complex mostly cystic, mid-right  2 mm hypoechoic, superior left  Lymphadenopathy: None visualized.  IMPRESSION:  Dominant 3.4 cm right thyroid complex lesion. Findings meet  consensus criteria for biopsy. Ultrasound-guided fine needle  aspiration should be considered, as per the consensus statement:  Management of Thyroid Nodules Detected at [place]: Society of  Radiologists in Ultrasound Consensus Conference Statement.  Radiology 2005; [id number].  Fine-needle aspiration was performed showing benign thyroid tissue consistent with nonneoplastic goiter.  Lastly the patient has a history of significant soft tissue injury to her left lower extremity approximately 3 years ago. She apparently had a large  wound anteriorly just above her ankle which required prolonged treatment and surgery by orthopedics. She is concerned about some persistent discomfort at the site and some swelling and prominence of the veins on the dorsum of her foot. She is wondering if any surgical intervention is indicated.  Past Medical History  Diagnosis Date  . Night sweats 02/10/2011  . Weight increase 02/10/2011  . Acid reflux 02/10/2011  . Asthma   . Bruises easily 02/10/2011  . Joint pain 02/10/2011  . Glaucoma   . Diabetes mellitus   . Arthritis   . Fibromyalgia 02/10/2011  . Cancer   . Breast cancer 02/10/2011    left breast  . Cervical cancer 02/10/2011  . Goiter 2014   Past Surgical History  Procedure Laterality Date  . Spine surgery    . Cervical cone biopsy    . Uterine suspension      Growth/mass removal  . Breast lumpectomy  02/2011    left  . Leg surgery  may 2011   Current Outpatient Prescriptions  Medication Sig Dispense Refill  . aspirin 81 MG tablet Take 81 mg by mouth daily.        . AZOPT 1 % ophthalmic suspension BID times 48H.      . betaxolol (BETOPTIC-S) 0.25 % ophthalmic suspension 1 drop 2 (two) times daily.        . COD LIVER OIL PO Take by mouth.      . MULTIPLE VITAMIN PO Take 1 tablet by mouth daily.      . Multiple Vitamins-Minerals (EYE VITAMINS) CAPS Take by mouth.      . Vitamin  Mixture (ESTER-C PO) Take by mouth.      Marland Kitchen XALATAN 0.005 % ophthalmic solution daily.       No current facility-administered medications for this visit.   Allergies  Allergen Reactions  . Bactrim   . Doxycycline   . Latex   . Tetracyclines & Related    Exam: BP 130/78  Pulse 68  Temp(Src) 97.3 F (36.3 C) (Temporal)  Resp 16  Ht 5' 2.5" (1.588 m)  Wt 125 lb 12.8 oz (57.063 kg)  BMI 22.63 kg/m2 General: Thin Caucasian female appearing younger than her age Skin: The retrospection HEENT: There is a soft freely movable approximately 3-1/2 cm mass easily palpable in the right lobe of the  thyroid. No other masses. Lymph nodes: No cervical, subclavicular or axillary nodes palpable Lungs: Clear breath sounds bilaterally Breasts: Well-healed lumpectomy site upper left breast. Minimal thickening here. No masses or other abnormalities. Extremities: There is a healed wound with soft tissue depression and defect anteriorly just above the left ankle. There is some mild edema and prominence of the superficial veins distal to this over the dorsum of the foot.  Assessment and plan: #1 status post left breast lumpectomy for low-grade DCIS. No radiation or hormonal treatment with observation alone. No evidence of recurrence or new breast cancer  #2 complex cystic/solid nodule right lobe of the thyroid. We discussed this in detail and she is given literature regarding thyroid nodules. I discussed options of thyroidectomy versus observation. Particularly at her age I think that close observation would be reasonable if she did not want thyroidectomy at this point that is what she prefers. She understands there is a small chance this is a malignancy. She was given literature regarding thyroid nodules. We will plan to see her back in 6 months with a repeat thyroid ultrasound. I asked her to call me should she notice any increasing symptoms.  #3 left leg discomfort and mild swelling status post traumatic wound of surgery to the left lower extremity. I told her that this problem is not really in my area especially but I strongly doubt that any surgical intervention would be helpful. I discussed the possibly physical therapy would be helpful. She is already wearing her compression garment. I suggested that a second opinion from another one at the [facility] surgeon might be helpful and this is what she has elected to do.  Return 6 months

## 2013-06-19 NOTE — Patient Instructions (Addendum)
Thyroid Diseases  Your thyroid is a butterfly-shaped gland in your neck. It is located just above your collarbone. It is one of your endocrine glands, which make hormones. The thyroid helps set your metabolism. Metabolism is how your body gets energy from the foods you eat.   Millions of people have thyroid diseases. Women experience thyroid problems more often than men. In fact, overactive thyroid problems (hyperthyroidism) occur in 1% of all women. If you have a thyroid disease, your body may use energy more slowly or quickly than it should.   Thyroid problems also include an immune disease where your body reacts against your thyroid gland (called thyroiditis). A different problem involves lumps and bumps (called nodules) that develop in the gland. The nodules are usually, but not always, noncancerous.  THE MOST COMMON THYROID PROBLEMS AND CAUSES ARE DISCUSSED BELOW  There are many causes for thyroid problems. Treatment depends upon the exact diagnosis and includes trying to reset your body's metabolism to a normal rate.  Hyperthyroidism  Too much thyroid hormone from an overactive thyroid gland is called hyperthyroidism. In hyperthyroidism, the body's metabolism speeds up. One of the most frequent forms of hyperthyroidism is known as Graves' disease. Graves' disease tends to run in families. Although Graves' is thought to be caused by a problem with the immune system, the exact nature of the genetic problem is unknown.  Hypothyroidism  Too little thyroid hormone from an underactive thyroid gland is called hypothyroidism. In hypothyroidism, the body's metabolism is slowed. Several things can cause this condition. Most causes affect the thyroid gland directly and hurt its ability to make enough hormone.   Rarely, there may be a pituitary gland tumor (located near the base of the brain). The tumor can block the pituitary from producing thyroid-stimulating hormone (TSH). Your body makes TSH to stimulate the thyroid  to work properly. If the pituitary does not make enough TSH, the thyroid fails to make enough hormones needed for good health.  Whether the problem is caused by thyroid conditions or by the pituitary gland, the result is that the thyroid is not making enough hormones. Hypothyroidism causes many physical and mental processes to become sluggish. The body consumes less oxygen and produces less body heat.  Thyroid Nodules  A thyroid nodule is a small swelling or lump in the thyroid gland. They are common. These nodules represent either a growth of thyroid tissue or a fluid-filled cyst. Both form a lump in the thyroid gland. Almost half of all people will have tiny thyroid nodules at some point in their lives. Typically, these are not noticeable until they become large and affect normal thyroid size. Larger nodules that are greater than a half inch across (about 1 centimeter) occur in about 5 percent of people.  Although most nodules are not cancerous, people who have them should seek medical care to rule out cancer. Also, some thyroid nodules may produce too much thyroid hormone or become too large. Large nodules or a large gland can interfere with breathing or swallowing or may cause neck discomfort.  Other problems  Other thyroid problems include cancer and thyroiditis. Thyroiditis is a malfunction of the body's immune system. Normally, the immune system works to defend the body against infection and other problems. When the immune system is not working properly, it may mistakenly attack normal cells, tissues, and organs. Examples of autoimmune diseases are Hashimoto's thyroiditis (which causes low thyroid function) and Graves' disease (which causes excess thyroid function).  SYMPTOMS   Symptoms   vary greatly depending upon the exact type of problem with the thyroid.  Hyperthyroidism-is when your thyroid is too active and makes more thyroid hormone than your body needs. The most common cause is Graves' Disease. Too  much thyroid hormone can cause some or all of the following symptoms:  · Anxiety.  · Irritability.  · Difficulty sleeping.  · Fatigue.  · A rapid or irregular heartbeat.  · A fine tremor of your hands or fingers.  · An increase in perspiration.  · Sensitivity to heat.  · Weight loss, despite normal food intake.  · Brittle hair.  · Enlargement of your thyroid gland (goiter).  · Light menstrual periods.  · Frequent bowel movements.  Graves' disease can specifically cause eye and skin problems. The skin problems involve reddening and swelling of the skin, often on your shins and on the top of your feet. Eye problems can include the following:  · Excess tearing and sensation of grit or sand in either or both eyes.  · Reddened or inflamed eyes.  · Widening of the space between your eyelids.  · Swelling of the lids and tissues around the eyes.  · Light sensitivity.  · Ulcers on the cornea.  · Double vision.  · Limited eye movements.  · Blurred or reduced vision.  Hypothyroidism- is when your thyroid gland is not active enough. This is more common than hyperthyroidism. Symptoms can vary a lot depending of the severity of the hormone deficiency. Symptoms may develop over a long period of time and can include several of the following:  · Fatigue.  · Sluggishness.  · Increased sensitivity to cold.  · Constipation.  · Pale, dry skin.  · A puffy face.  · Hoarse voice.  · High blood cholesterol level.  · Unexplained weight gain.  · Muscle aches, tenderness and stiffness.  · Pain, stiffness or swelling in your joints.  · Muscle weakness.  · Heavier than normal menstrual periods.  · Brittle fingernails and hair.  · Depression.  Thyroid Nodules - most do not cause signs or symptoms. Occasionally, some may become so large that you can feel or even see the swelling at the base of your neck. You may realize a lump or swelling is there when you are shaving or putting on makeup. Men might become aware of a nodule when shirt collars  suddenly feel too tight.  Some nodules produce too much thyroid hormone. This can produce the same symptoms as hyperthyroidism (see above).  Thyroid nodules are seldom cancerous. However, a nodule is more likely to be malignant (cancerous) if it:  · Grows quickly or feels hard.  · Causes you to become hoarse or to have trouble swallowing or breathing.  · Causes enlarged lymph nodes under your jaw or in your neck.  DIAGNOSIS   Because there are so many possible thyroid conditions, your caregiver may ask for a number of tests. They will do this in order to narrow down the exact diagnosis. These tests can include:  · Blood and antibody tests.  · Special thyroid scans using small, safe amounts of radioactive iodine.  · Ultrasound of the thyroid gland (particularly if there is a nodule or lump).  · Biopsy. This is usually done with a special needle. A needle biopsy is a procedure to obtain a sample of cells from the thyroid. The tissue will be tested in a lab and examined under a microscope.  TREATMENT   Treatment depends on the exact diagnosis.    Hyperthyroidism  · Beta-blockers help relieve many of the symptoms.  · Anti-thyroid medications prevent the thyroid from making excess hormones.  · Radioactive iodine treatment can destroy overactive thyroid cells. The iodine can permanently decrease the amount of hormone produced.  · Surgery to remove the thyroid gland.  · Treatments for eye problems that come from Graves' disease also include medications and special eye surgery, if felt to be appropriate.  Hypothyroidism  Thyroid replacement with levothyroxine is the mainstay of treatment. Treatment with thyroid replacement is usually lifelong and will require monitoring and adjustment from time to time.  Thyroid Nodules  · Watchful waiting. If a small nodule causes no symptoms or signs of cancer on biopsy, then no treatment may be chosen at first. Re-exam and re-checking blood tests would be the recommended  follow-up.  · Anti-thyroid medications or radioactive iodine treatment may be recommended if the nodules produce too much thyroid hormone (see Treatment for Hyperthyroidism above).  · Alcohol ablation. Injections of small amounts of ethyl alcohol (ethanol) can cause a non-cancerous nodule to shrink in size.  · Surgery (see Treatment for Hyperthyroidism above).  HOME CARE INSTRUCTIONS   · Take medications as instructed.  · Follow through on recommended testing.  SEEK MEDICAL CARE IF:   · You feel that you are developing symptoms of Hyperthyroidism or Hypothyroidism as described above.  · You develop a new lump/nodule in the neck/thyroid area that you had not noticed before.  · You feel that you are having side effects from medicines prescribed.  · You develop trouble breathing or swallowing.  SEEK IMMEDIATE MEDICAL CARE IF:   · You develop a fever of 102° F (38.9° C) or higher.  · You develop severe sweating.  · You develop palpitations and/or rapid heart beat.  · You develop shortness of breath.  · You develop nausea and vomiting.  · You develop extreme shakiness.  · You develop agitation.  · You develop lightheadedness or have a fainting episode.  Document Released: 09/03/2007 Document Revised: 01/29/2012 Document Reviewed: 09/03/2007  [employer]® Patient Information ©2014 [employer].

## 2013-09-11 NOTE — Telephone Encounter (Signed)
No entry 

## 2013-11-04 ENCOUNTER — Telehealth (INDEPENDENT_AMBULATORY_CARE_PROVIDER_SITE_OTHER): Payer: Self-pay | Admitting: *Deleted

## 2013-11-04 NOTE — Telephone Encounter (Signed)
LMOM for pt to return my call.  I was calling to notify her of an appt for her f/u thyroid US at [id number] on 12/08/13 with an arrival time of 10:45am.

## 2013-11-07 NOTE — Telephone Encounter (Signed)
Pt returned my call and below information was given.  Pt is agreeable.

## 2013-12-04 ENCOUNTER — Other Ambulatory Visit: Payer: Medicare Other

## 2013-12-08 ENCOUNTER — Other Ambulatory Visit: Payer: Medicare Other

## 2013-12-11 ENCOUNTER — Ambulatory Visit (INDEPENDENT_AMBULATORY_CARE_PROVIDER_SITE_OTHER): Payer: Medicare Other | Admitting: General Surgery

## 2014-03-19 ENCOUNTER — Other Ambulatory Visit: Payer: Self-pay | Admitting: Oncology

## 2014-03-19 DIAGNOSIS — Z853 Personal history of malignant neoplasm of breast: Secondary | ICD-10-CM

## 2014-04-16 ENCOUNTER — Ambulatory Visit (HOSPITAL_BASED_OUTPATIENT_CLINIC_OR_DEPARTMENT_OTHER): Payer: Medicare HMO | Admitting: Physician Assistant

## 2014-04-16 ENCOUNTER — Encounter: Payer: Self-pay | Admitting: Physician Assistant

## 2014-04-16 ENCOUNTER — Telehealth: Payer: Self-pay | Admitting: Oncology

## 2014-04-16 ENCOUNTER — Other Ambulatory Visit (HOSPITAL_BASED_OUTPATIENT_CLINIC_OR_DEPARTMENT_OTHER): Payer: Medicare HMO

## 2014-04-16 VITALS — BP 154/73 | HR 86 | Temp 98.1°F | Resp 20 | Ht 62.5 in | Wt 124.7 lb

## 2014-04-16 DIAGNOSIS — Z87898 Personal history of other specified conditions: Secondary | ICD-10-CM

## 2014-04-16 DIAGNOSIS — C50919 Malignant neoplasm of unspecified site of unspecified female breast: Secondary | ICD-10-CM

## 2014-04-16 DIAGNOSIS — Z853 Personal history of malignant neoplasm of breast: Secondary | ICD-10-CM

## 2014-04-16 DIAGNOSIS — E041 Nontoxic single thyroid nodule: Secondary | ICD-10-CM

## 2014-04-16 LAB — CBC WITH DIFFERENTIAL/PLATELET
BASO%: 0.4 % (ref 0.0–2.0)
Basophils Absolute: 0 10*3/uL (ref 0.0–0.1)
EOS%: 1.5 % (ref 0.0–7.0)
Eosinophils Absolute: 0.1 10*3/uL (ref 0.0–0.5)
HEMATOCRIT: 38.6 % (ref 34.8–46.6)
HEMOGLOBIN: 12.7 g/dL (ref 11.6–15.9)
LYMPH#: 2 10*3/uL (ref 0.9–3.3)
LYMPH%: 24.8 % (ref 14.0–49.7)
MCH: 30.5 pg (ref 25.1–34.0)
MCHC: 32.8 g/dL (ref 31.5–36.0)
MCV: 93.1 fL (ref 79.5–101.0)
MONO#: 0.6 10*3/uL (ref 0.1–0.9)
MONO%: 7.3 % (ref 0.0–14.0)
NEUT#: 5.2 10*3/uL (ref 1.5–6.5)
NEUT%: 66 % (ref 38.4–76.8)
Platelets: 358 10*3/uL (ref 145–400)
RBC: 4.15 10*6/uL (ref 3.70–5.45)
RDW: 12.9 % (ref 11.2–14.5)
WBC: 7.9 10*3/uL (ref 3.9–10.3)

## 2014-04-16 LAB — COMPREHENSIVE METABOLIC PANEL (CC13)
ALBUMIN: 3.3 g/dL — AB (ref 3.5–5.0)
ALT: 21 U/L (ref 0–55)
ANION GAP: 9 meq/L (ref 3–11)
AST: 18 U/L (ref 5–34)
Alkaline Phosphatase: 72 U/L (ref 40–150)
BUN: 23.1 mg/dL (ref 7.0–26.0)
CALCIUM: 9.3 mg/dL (ref 8.4–10.4)
CHLORIDE: 106 meq/L (ref 98–109)
CO2: 26 meq/L (ref 22–29)
CREATININE: 0.8 mg/dL (ref 0.6–1.1)
Glucose: 118 mg/dl (ref 70–140)
POTASSIUM: 4.1 meq/L (ref 3.5–5.1)
Sodium: 141 mEq/L (ref 136–145)
TOTAL PROTEIN: 6.8 g/dL (ref 6.4–8.3)
Total Bilirubin: 0.25 mg/dL (ref 0.20–1.20)
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
## 2014-04-16 NOTE — Telephone Encounter (Signed)
, °

## 2014-05-19 ENCOUNTER — Other Ambulatory Visit: Payer: Self-pay | Admitting: Neurosurgery

## 2014-05-19 DIAGNOSIS — M47817 Spondylosis without myelopathy or radiculopathy, lumbosacral region: Secondary | ICD-10-CM

## 2014-06-04 ENCOUNTER — Ambulatory Visit
Admission: RE | Admit: 2014-06-04 | Discharge: 2014-06-04 | Disposition: A | Discharge status: Home / Self Care | Payer: Medicare HMO | Source: Ambulatory Visit | Attending: Physician Assistant | Admitting: Physician Assistant

## 2014-06-04 ENCOUNTER — Encounter (INDEPENDENT_AMBULATORY_CARE_PROVIDER_SITE_OTHER): Payer: Self-pay

## 2014-06-04 DIAGNOSIS — Z853 Personal history of malignant neoplasm of breast: Secondary | ICD-10-CM

## 2014-12-01 ENCOUNTER — Telehealth: Payer: Self-pay | Admitting: Diagnostic Neuroimaging
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
## 2014-12-03 NOTE — Telephone Encounter (Signed)
Spoke with Caryl Pina. Offered to send medical records in request to speak about patient's treatment in 2012. Caryl Pina in return asked if Dr. Leta Baptist could speak with an atty for 30 mins. Advised per Dr. Leta Baptist next step would be deposition. Caryl Pina stated she would let the atty know.

## 2015-04-01 ENCOUNTER — Telehealth: Payer: Self-pay | Admitting: Oncology

## 2015-04-01 NOTE — Telephone Encounter (Signed)
Returned Advertising account executive. Patient confirmed appointment change to August due to not being able to get Mammo till July.

## 2015-04-20 ENCOUNTER — Other Ambulatory Visit: Payer: Medicare HMO

## 2015-04-20 ENCOUNTER — Ambulatory Visit: Payer: Medicare HMO | Admitting: Oncology

## 2015-04-23 ENCOUNTER — Other Ambulatory Visit: Payer: Self-pay | Admitting: Oncology

## 2015-04-23 DIAGNOSIS — Z853 Personal history of malignant neoplasm of breast: Secondary | ICD-10-CM

## 2015-06-08 ENCOUNTER — Ambulatory Visit
Admission: RE | Admit: 2015-06-08 | Discharge: 2015-06-08 | Disposition: A | Discharge status: Home / Self Care | Payer: Medicare HMO | Source: Ambulatory Visit | Attending: Oncology | Admitting: Oncology

## 2015-06-08 DIAGNOSIS — Z853 Personal history of malignant neoplasm of breast: Secondary | ICD-10-CM

## 2015-07-13 ENCOUNTER — Other Ambulatory Visit: Payer: Self-pay | Admitting: *Deleted

## 2015-07-13 DIAGNOSIS — C50919 Malignant neoplasm of unspecified site of unspecified female breast: Secondary | ICD-10-CM

## 2015-07-14 ENCOUNTER — Ambulatory Visit (HOSPITAL_BASED_OUTPATIENT_CLINIC_OR_DEPARTMENT_OTHER): Payer: Medicare HMO | Admitting: Oncology

## 2015-07-14 ENCOUNTER — Other Ambulatory Visit (HOSPITAL_BASED_OUTPATIENT_CLINIC_OR_DEPARTMENT_OTHER): Payer: Medicare HMO

## 2015-07-14 ENCOUNTER — Telehealth: Payer: Self-pay | Admitting: Oncology

## 2015-07-14 VITALS — BP 146/54 | HR 83 | Temp 98.2°F | Resp 17 | Ht 62.5 in | Wt 122.9 lb

## 2015-07-14 DIAGNOSIS — Z853 Personal history of malignant neoplasm of breast: Secondary | ICD-10-CM | POA: Diagnosis not present

## 2015-07-14 DIAGNOSIS — Z0000 Encounter for general adult medical examination without abnormal findings: Secondary | ICD-10-CM

## 2015-07-14 DIAGNOSIS — C50012 Malignant neoplasm of nipple and areola, left female breast: Secondary | ICD-10-CM | POA: Insufficient documentation

## 2015-07-14 DIAGNOSIS — C50911 Malignant neoplasm of unspecified site of right female breast: Secondary | ICD-10-CM

## 2015-07-14 DIAGNOSIS — C50919 Malignant neoplasm of unspecified site of unspecified female breast: Secondary | ICD-10-CM

## 2015-07-14 DIAGNOSIS — Z170 Estrogen receptor positive status [ER+]: Secondary | ICD-10-CM

## 2015-07-14 LAB — CBC WITH DIFFERENTIAL/PLATELET
BASO%: 0.6 % (ref 0.0–2.0)
BASOS ABS: 0 10*3/uL (ref 0.0–0.1)
EOS%: 1.9 % (ref 0.0–7.0)
Eosinophils Absolute: 0.1 10*3/uL (ref 0.0–0.5)
HEMATOCRIT: 42.3 % (ref 34.8–46.6)
HGB: 14.1 g/dL (ref 11.6–15.9)
LYMPH#: 2 10*3/uL (ref 0.9–3.3)
LYMPH%: 30.3 % (ref 14.0–49.7)
MCH: 31.1 pg (ref 25.1–34.0)
MCHC: 33.4 g/dL (ref 31.5–36.0)
MCV: 93 fL (ref 79.5–101.0)
MONO#: 0.6 10*3/uL (ref 0.1–0.9)
MONO%: 8.7 % (ref 0.0–14.0)
NEUT#: 4 10*3/uL (ref 1.5–6.5)
NEUT%: 58.5 % (ref 38.4–76.8)
PLATELETS: 275 10*3/uL (ref 145–400)
RBC: 4.55 10*6/uL (ref 3.70–5.45)
RDW: 13.4 % (ref 11.2–14.5)
WBC: 6.8 10*3/uL (ref 3.9–10.3)

## 2015-07-14 LAB — COMPREHENSIVE METABOLIC PANEL (CC13)
ALT: 21 U/L (ref 0–55)
AST: 19 U/L (ref 5–34)
Albumin: 3.8 g/dL (ref 3.5–5.0)
Alkaline Phosphatase: 65 U/L (ref 40–150)
Anion Gap: 8 mEq/L (ref 3–11)
BUN: 16.2 mg/dL (ref 7.0–26.0)
CALCIUM: 9.8 mg/dL (ref 8.4–10.4)
CO2: 28 mEq/L (ref 22–29)
Chloride: 106 mEq/L (ref 98–109)
Creatinine: 0.8 mg/dL (ref 0.6–1.1)
EGFR: 68 mL/min/{1.73_m2} — ABNORMAL LOW (ref >90)
Glucose: 88 mg/dl (ref 70–140)
POTASSIUM: 4.5 meq/L (ref 3.5–5.1)
Sodium: 142 mEq/L (ref 136–145)
Total Bilirubin: 0.54 mg/dL (ref 0.20–1.20)
Total Protein: 7.1 g/dL (ref 6.4–8.3)

## 2015-07-14 NOTE — Telephone Encounter (Signed)
Appointments made and referral noted for [facility] and patient has the number as well
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
## 2015-08-19 ENCOUNTER — Other Ambulatory Visit: Payer: Self-pay | Admitting: Obstetrics and Gynecology

## 2015-08-19 DIAGNOSIS — Z780 Asymptomatic menopausal state: Secondary | ICD-10-CM

## 2015-08-24 ENCOUNTER — Other Ambulatory Visit: Payer: Self-pay | Admitting: Obstetrics and Gynecology

## 2015-08-24 DIAGNOSIS — E2839 Other primary ovarian failure: Secondary | ICD-10-CM

## 2015-09-22 DIAGNOSIS — N3946 Mixed incontinence: Secondary | ICD-10-CM | POA: Diagnosis not present

## 2015-09-22 DIAGNOSIS — R159 Full incontinence of feces: Secondary | ICD-10-CM | POA: Diagnosis not present

## 2015-09-23 ENCOUNTER — Ambulatory Visit
Admission: RE | Admit: 2015-09-23 | Discharge: 2015-09-23 | Disposition: A | Discharge status: Home / Self Care | Payer: Medicare HMO | Source: Ambulatory Visit | Attending: Obstetrics and Gynecology | Admitting: Obstetrics and Gynecology

## 2015-09-23 DIAGNOSIS — E2839 Other primary ovarian failure: Secondary | ICD-10-CM

## 2015-09-23 DIAGNOSIS — M859 Disorder of bone density and structure, unspecified: Secondary | ICD-10-CM | POA: Diagnosis not present

## 2015-10-12 DIAGNOSIS — Z961 Presence of intraocular lens: Secondary | ICD-10-CM | POA: Diagnosis not present

## 2015-10-12 DIAGNOSIS — H401133 Primary open-angle glaucoma, bilateral, severe stage: Secondary | ICD-10-CM | POA: Diagnosis not present

## 2015-10-19 DIAGNOSIS — M4727 Other spondylosis with radiculopathy, lumbosacral region: Secondary | ICD-10-CM | POA: Diagnosis not present

## 2016-02-08 DIAGNOSIS — H401133 Primary open-angle glaucoma, bilateral, severe stage: Secondary | ICD-10-CM | POA: Diagnosis not present

## 2016-05-25 ENCOUNTER — Other Ambulatory Visit: Payer: Self-pay | Admitting: Oncology

## 2016-05-25 DIAGNOSIS — Z853 Personal history of malignant neoplasm of breast: Secondary | ICD-10-CM

## 2016-06-08 ENCOUNTER — Ambulatory Visit
Admission: RE | Admit: 2016-06-08 | Discharge: 2016-06-08 | Disposition: A | Discharge status: Home / Self Care | Payer: Medicare HMO | Source: Ambulatory Visit | Attending: Oncology | Admitting: Oncology

## 2016-06-08 DIAGNOSIS — R928 Other abnormal and inconclusive findings on diagnostic imaging of breast: Secondary | ICD-10-CM | POA: Diagnosis not present

## 2016-06-08 DIAGNOSIS — Z853 Personal history of malignant neoplasm of breast: Secondary | ICD-10-CM

## 2016-06-15 DIAGNOSIS — Z961 Presence of intraocular lens: Secondary | ICD-10-CM | POA: Diagnosis not present

## 2016-06-15 DIAGNOSIS — H401133 Primary open-angle glaucoma, bilateral, severe stage: Secondary | ICD-10-CM | POA: Diagnosis not present

## 2016-07-11 ENCOUNTER — Encounter: Payer: Medicare HMO | Admitting: Nurse Practitioner

## 2016-07-21 ENCOUNTER — Encounter: Payer: Medicare HMO | Admitting: Adult Health

## 2016-08-01 ENCOUNTER — Telehealth: Payer: Self-pay | Admitting: Oncology

## 2016-08-01 NOTE — Telephone Encounter (Signed)
Faxed pt records to [facility] [phone] °

## 2016-08-03 ENCOUNTER — Telehealth: Payer: Self-pay | Admitting: Adult Health

## 2016-08-03 ENCOUNTER — Ambulatory Visit (HOSPITAL_BASED_OUTPATIENT_CLINIC_OR_DEPARTMENT_OTHER): Payer: Medicare HMO | Admitting: Adult Health

## 2016-08-03 ENCOUNTER — Encounter: Payer: Self-pay | Admitting: Adult Health

## 2016-08-03 VITALS — BP 135/67 | HR 86 | Temp 98.2°F | Resp 18 | Ht 62.5 in | Wt 122.8 lb

## 2016-08-03 DIAGNOSIS — C50911 Malignant neoplasm of unspecified site of right female breast: Secondary | ICD-10-CM

## 2016-08-03 DIAGNOSIS — Z853 Personal history of malignant neoplasm of breast: Secondary | ICD-10-CM
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
## 2016-08-03 NOTE — Telephone Encounter (Signed)
appt made and letter sent by mail °

## 2016-08-15 DIAGNOSIS — H401133 Primary open-angle glaucoma, bilateral, severe stage: Secondary | ICD-10-CM | POA: Diagnosis not present

## 2016-09-19 DIAGNOSIS — Z961 Presence of intraocular lens: Secondary | ICD-10-CM | POA: Diagnosis not present

## 2016-09-19 DIAGNOSIS — H401133 Primary open-angle glaucoma, bilateral, severe stage: Secondary | ICD-10-CM | POA: Diagnosis not present

## 2017-02-27 DIAGNOSIS — H401133 Primary open-angle glaucoma, bilateral, severe stage: Secondary | ICD-10-CM | POA: Diagnosis not present

## 2017-02-27 DIAGNOSIS — Z961 Presence of intraocular lens: Secondary | ICD-10-CM | POA: Diagnosis not present

## 2017-03-12 ENCOUNTER — Telehealth: Payer: Self-pay | Admitting: Oncology

## 2017-03-12 ENCOUNTER — Encounter: Payer: Self-pay | Admitting: Adult Health

## 2017-03-12 ENCOUNTER — Ambulatory Visit (HOSPITAL_BASED_OUTPATIENT_CLINIC_OR_DEPARTMENT_OTHER): Payer: Medicare HMO | Admitting: Adult Health

## 2017-03-12 ENCOUNTER — Telehealth: Payer: Self-pay

## 2017-03-12 VITALS — BP 150/52 | HR 96 | Temp 97.9°F | Resp 18 | Ht 62.5 in | Wt 123.6 lb

## 2017-03-12 DIAGNOSIS — L539 Erythematous condition, unspecified: Secondary | ICD-10-CM | POA: Diagnosis not present

## 2017-03-12 DIAGNOSIS — Z853 Personal history of malignant neoplasm of breast: Secondary | ICD-10-CM

## 2017-03-12 DIAGNOSIS — N644 Mastodynia: Secondary | ICD-10-CM

## 2017-03-12 DIAGNOSIS — C50012 Malignant neoplasm of nipple and areola, left female breast: Secondary | ICD-10-CM

## 2017-03-12 DIAGNOSIS — N63 Unspecified lump in unspecified breast: Secondary | ICD-10-CM

## 2017-03-12 DIAGNOSIS — N61 Mastitis without abscess: Secondary | ICD-10-CM

## 2017-03-12 DIAGNOSIS — Z170 Estrogen receptor positive status [ER+]: Secondary | ICD-10-CM

## 2017-03-12 MED ORDER — DOXYCYCLINE HYCLATE 100 MG PO TABS: 100.0000 mg | ORAL_TABLET | Freq: Two times a day (BID) | ORAL | 0 refills | Status: DC

## 2017-03-12 NOTE — Telephone Encounter (Signed)
Pt called with reports of hardening of left nipple with painful erythema around nipple of left breast x 3 days.  Per Dr Jana Hakim have pt come in today.  Msg sent to scheduling.  Pt made aware
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
## 2017-03-12 NOTE — Telephone Encounter (Signed)
Appointment scheduled per scheduling message with [facility] per patient unavailable for 12 PM with GM.

## 2017-05-01 ENCOUNTER — Other Ambulatory Visit: Payer: Self-pay | Admitting: Oncology

## 2017-05-01 DIAGNOSIS — Z853 Personal history of malignant neoplasm of breast: Secondary | ICD-10-CM

## 2017-06-12 ENCOUNTER — Ambulatory Visit
Admission: RE | Admit: 2017-06-12 | Discharge: 2017-06-12 | Disposition: A | Discharge status: Home / Self Care | Payer: Medicare HMO | Source: Ambulatory Visit | Attending: Oncology | Admitting: Oncology

## 2017-06-12 DIAGNOSIS — Z853 Personal history of malignant neoplasm of breast: Secondary | ICD-10-CM

## 2017-06-12 DIAGNOSIS — R928 Other abnormal and inconclusive findings on diagnostic imaging of breast: Secondary | ICD-10-CM | POA: Diagnosis not present

## 2017-07-03 DIAGNOSIS — Z961 Presence of intraocular lens: Secondary | ICD-10-CM | POA: Diagnosis not present

## 2017-07-03 DIAGNOSIS — H401133 Primary open-angle glaucoma, bilateral, severe stage: Secondary | ICD-10-CM | POA: Diagnosis not present

## 2017-08-03 ENCOUNTER — Encounter: Payer: Medicare HMO | Admitting: Adult Health

## 2017-08-13 ENCOUNTER — Telehealth: Payer: Self-pay

## 2017-08-13 ENCOUNTER — Encounter: Payer: Self-pay | Admitting: Adult Health

## 2017-08-13 ENCOUNTER — Ambulatory Visit (HOSPITAL_BASED_OUTPATIENT_CLINIC_OR_DEPARTMENT_OTHER): Payer: Medicare HMO | Admitting: Adult Health

## 2017-08-13 VITALS — BP 147/69 | HR 89 | Temp 97.9°F | Resp 18 | Ht 62.5 in | Wt 126.6 lb

## 2017-08-13 DIAGNOSIS — Z170 Estrogen receptor positive status [ER+]: Principal | ICD-10-CM

## 2017-08-13 DIAGNOSIS — C50012 Malignant neoplasm of nipple and areola, left female breast: Secondary | ICD-10-CM

## 2017-08-13 DIAGNOSIS — Z86000 Personal history of in-situ neoplasm of breast: Secondary | ICD-10-CM

## 2017-08-13 NOTE — Progress Notes (Signed)
CLINIC:  Survivorship   REASON FOR VISIT:  Routine follow-up for history of breast cancer.   BRIEF ONCOLOGIC HISTORY:  Copied from Dr. 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  Overall, she reports feeling quite well. She is not having any issues today.  She is looking for a PCP, but otherwise is doing well and has no concerns.  She is exercising regularly.      REVIEW OF SYSTEMS:  Review of Systems  Constitutional: Negative for appetite change, chills, fatigue, fever and unexpected weight change.  HENT:   Negative for hearing loss and lump/mass.   Eyes: Negative for eye problems and icterus.  Respiratory: Negative for chest tightness, cough and shortness of breath.   Cardiovascular: Negative for chest pain, leg swelling and palpitations.  Gastrointestinal: Negative for abdominal distention, abdominal pain, constipation, diarrhea, nausea and vomiting.  Endocrine: Negative for hot flashes.  Musculoskeletal: Negative for arthralgias.  Skin: Negative for itching and rash.  Neurological: Negative for dizziness, extremity weakness, headaches and numbness.  Hematological: Negative for adenopathy. Does not bruise/bleed easily.  Psychiatric/Behavioral: Negative for depression. The patient is not nervous/anxious.   Breast: Denies any new nodularity, masses, tenderness, nipple changes, or nipple discharge.       PAST MEDICAL/SURGICAL HISTORY:  Past Medical History:  Diagnosis Date  . Acid reflux 02/10/2011  . Arthritis   . Asthma   . Breast cancer ([place]) 02/10/2011   left breast  .  Bruises easily 02/10/2011  . Cancer ([place])   . Cervical cancer ([place]) 02/10/2011  . Diabetes mellitus   . Fibromyalgia 02/10/2011  . Glaucoma   . Goiter 2014  . Joint pain 02/10/2011  . Night sweats 02/10/2011  . Weight increase 02/10/2011   Past Surgical History:  Procedure Laterality Date  . BREAST LUMPECTOMY  02/2011   left  . CERVICAL CONE BIOPSY    . LEG SURGERY  may 2011  . SPINE SURGERY    . UTERINE SUSPENSION     Growth/mass removal     ALLERGIES:  Allergies  Allergen Reactions  . Latanoprost Other (See Comments)    Asthma, generic gives her asthma, brand name does not  . Bactrim Nausea And Vomiting  . Doxycycline Nausea And Vomiting    Glaucoma damage And headache  . Tetracyclines & Related Nausea And Vomiting    And headache  . Latex Rash    welts     CURRENT MEDICATIONS:  Outpatient Encounter Prescriptions as of 08/13/2017  Medication Sig  . aspirin 81 MG tablet Take 81 mg by mouth daily.    . AZOPT 1 % ophthalmic suspension BID times 48H.  . betaxolol (BETOPTIC-S) 0.25 % ophthalmic suspension 1 drop 2 (two) times daily.    . COD LIVER OIL PO Take by mouth.  . MULTIPLE VITAMIN PO Take 1 tablet by mouth daily.  . Multiple Vitamins-Minerals (EYE VITAMINS) CAPS Take by mouth.  . Vitamin Mixture (ESTER-C PO) Take by mouth.  Marland Kitchen XALATAN 0.005 % ophthalmic solution daily.  . [DISCONTINUED] doxycycline (VIBRA-TABS) 100 MG tablet Take 1 tablet (100 mg total) by mouth 2 (two) times daily. (Patient not taking: Reported  on 08/13/2017)   No facility-administered encounter medications on file as of 08/13/2017.      ONCOLOGIC FAMILY HISTORY:  Family History  Problem Relation Age of Onset  . Cancer Father        enviromental exposure  . Cancer Brother        enviromental exposure     SOCIAL HISTORY:  Stephanie Carney is divorced and lives alone in [place], [place].   Stephanie Carney is currently retired  She denies any current or history of tobacco, alcohol, or  illicit drug use.     PHYSICAL EXAMINATION:  Vital Signs: Vitals:   08/13/17 1058  BP: (!) 147/69  Pulse: 89  Resp: 18  Temp: 97.9 F (36.6 C)  SpO2: 98%   Filed Weights   08/13/17 1058  Weight: 126 lb 9.6 oz (57.4 kg)   General: Well-nourished, well-appearing female in no acute distress.  Unaccompanied today.   HEENT: Head is normocephalic.  Pupils equal and reactive to light. Conjunctivae clear without exudate.  Sclerae anicteric. Oral mucosa is pink, moist.  Oropharynx is pink without lesions or erythema.  Lymph: No cervical, supraclavicular, or infraclavicular lymphadenopathy noted on palpation.  Cardiovascular: Regular rate and rhythm.Marland Kitchen Respiratory: Clear to auscultation bilaterally. Chest expansion symmetric; breathing non-labored.  Breast Exam:  -Left breast: No appreciable masses on palpation. No skin redness, thickening, or peau d'orange appearance; no nipple retraction or nipple discharge; mild distortion in symmetry at previous lumpectomy site well healed scar without erythema or nodularity.  -Right breast: No appreciable masses on palpation. No skin redness, thickening, or peau d'orange appearance; no nipple retraction or nipple discharge;  -Axilla: No axillary adenopathy bilaterally.  GI: Abdomen soft and round; non-tender, non-distended. Bowel sounds normoactive. No hepatosplenomegaly.   GU: Deferred.  Neuro: No focal deficits. Steady gait.  Psych: Mood and affect normal and appropriate for situation.  MSK: No focal spinal tenderness to palpation, full range of motion in bilateral upper extremities Extremities: No edema. Skin: Warm and dry.  LABORATORY DATA:  None for this visit   DIAGNOSTIC IMAGING:  Most recent mammogram:     ASSESSMENT AND PLAN:  Ms.. Carney is a pleasant 83 y.o. female with history of Stage 0 left breast DCIS, ER+/PR+, diagnosed in 01/2011, treated with lumpectomy alone.  [facility] for surveillance and routine  follow-up.   1. History of breast cancer:  Stephanie Carney is currently clinically and radiographically without evidence of disease or recurrence of breast cancer. She will be due for mammogram in 05/2018; orders placed today. I encouraged her to call me with any questions or concerns before her next visit at the cancer center, and I would be happy to see her sooner, if needed.    2. Bone health:  Given Stephanie Carney's age, history of breast cancer, she is at risk for bone demineralization. I will defer to her PCP for future bone density testing and management. She was given education on specific food and activities to promote bone health.  3. Cancer screening:  Due to Stephanie Carney's history and her age, she should receive screening for skin cancers. She was encouraged to follow-up with her PCP for appropriate cancer screenings.   4. Health maintenance and wellness promotion: Stephanie Carney was encouraged to consume 5-7 servings of fruits and vegetables per day. She was also encouraged to engage in moderate to vigorous exercise for 30 minutes per day most days of the week. She was instructed to limit her alcohol  consumption and continue to abstain from tobacco use.    Dispo:  -Return to cancer center in one year for LTS follow up -Mammogram due in 05/2017   A total of (30) minutes of face-to-face time was spent with this patient with greater than 50% of that time in counseling and care-coordination.   Gardenia Phlegm, NP Survivorship [facility] [place] [phone]   Note: PRIMARY CARE PROVIDER System, Pcp Not In None None

## 2017-08-13 NOTE — Telephone Encounter (Signed)
Scheduled patient upcoming appointment for next year. per 9/24 los

## 2017-08-14 ENCOUNTER — Telehealth: Payer: Self-pay | Admitting: Adult Health

## 2017-08-14 NOTE — Telephone Encounter (Signed)
scheduled

## 2017-11-06 DIAGNOSIS — H401133 Primary open-angle glaucoma, bilateral, severe stage: Secondary | ICD-10-CM | POA: Diagnosis not present

## 2017-11-06 DIAGNOSIS — Z961 Presence of intraocular lens: Secondary | ICD-10-CM | POA: Diagnosis not present

## 2017-12-28 ENCOUNTER — Other Ambulatory Visit: Payer: Self-pay

## 2017-12-28 ENCOUNTER — Ambulatory Visit (HOSPITAL_COMMUNITY)
Admission: EM | Admit: 2017-12-28 | Discharge: 2017-12-28 | Disposition: A | Discharge status: Home / Self Care | Payer: Medicare HMO | Attending: Internal Medicine | Admitting: Internal Medicine

## 2017-12-28 ENCOUNTER — Encounter (HOSPITAL_COMMUNITY): Payer: Self-pay | Admitting: Emergency Medicine

## 2017-12-28 DIAGNOSIS — B029 Zoster without complications: Secondary | ICD-10-CM

## 2017-12-28 MED ORDER — TETRACAINE HCL 0.5 % OP SOLN
OPHTHALMIC | Status: AC
  Filled 2017-12-28: qty 4

## 2017-12-28 MED ORDER — VALACYCLOVIR HCL 1 G PO TABS: 1000.0000 mg | ORAL_TABLET | Freq: Three times a day (TID) | ORAL | 0 refills | Status: AC

## 2017-12-28 NOTE — Discharge Instructions (Signed)
Please take medication three times a day. Please follow up with your eye doctor on Monday for a recheck, they should be calling you. If you do not hear from them please call. Please establish with a primary care provider for recheck of symptoms. If you develop eye pain or change to your vision please return or go to ER immediately.

## 2017-12-28 NOTE — ED Triage Notes (Signed)
The patient presented to the [facility] with a complaint of a rash above her left eye that she believed to possibly be shingles.

## 2017-12-28 NOTE — ED Provider Notes (Signed)
[facility]    CSN: [id number] Arrival date & time: 12/28/17  1444     History   Chief Complaint Chief Complaint  Patient presents with  . Rash    HPI Stephanie Carney is a 84 y.o. female.   Stephanie Carney presents with complaints of lesion above left eye which developed approximately 1 week ago as well as left temporal and scalp pain. It is burning, shooting and stabbing sensation. She applied antibiotic ointment to it which has not helped. She did have left eye lid swelling which she feels has improved. The pain is off an on. No known drainage from the lesion. Scabbed. Without history of similar. Left jaw and ear feel painful. Denies vision change. She sees Dr. Yehuda Carney with [facility] with history of glaucoma. She does not have a current PCP.    ROS per HPI.       Past Medical History:  Diagnosis Date  . Acid reflux 02/10/2011  . Arthritis   . Asthma   . Breast cancer ([place]) 02/10/2011   left breast  . Bruises easily 02/10/2011  . Cancer ([place])   . Cervical cancer ([place]) 02/10/2011  . Diabetes mellitus   . Fibromyalgia 02/10/2011  . Glaucoma   . Goiter 2014  . Joint pain 02/10/2011  . Night sweats 02/10/2011  . Weight increase 02/10/2011    Patient Active Problem List   Diagnosis Date Noted  . Malignant neoplasm of areola of left breast in female, estrogen receptor positive ([place]) 07/14/2015  . Thyroid nodule 06/19/2013  . Pain in joint, lower leg 06/19/2013    Past Surgical History:  Procedure Laterality Date  . BREAST LUMPECTOMY  02/2011   left  . CERVICAL CONE BIOPSY    . LEG SURGERY  may 2011  . SPINE SURGERY    . UTERINE SUSPENSION     Growth/mass removal    OB History    No data available       Home Medications    Prior to Admission medications   Medication Sig Start Date End Date Taking? Authorizing Provider  aspirin 81 MG tablet Take 81 mg by mouth daily.      [provider]  AZOPT 1 % ophthalmic suspension BID times 48H.  10/11/11   [provider]  betaxolol (BETOPTIC-S) 0.25 % ophthalmic suspension 1 drop 2 (two) times daily.      [provider]  COD LIVER OIL PO Take by mouth.    [provider]  MULTIPLE VITAMIN PO Take 1 tablet by mouth daily.    [provider]  Multiple Vitamins-Minerals (EYE VITAMINS) CAPS Take by mouth.    [provider]  Vitamin Mixture (ESTER-C PO) Take by mouth.    [provider]  XALATAN 0.005 % ophthalmic solution daily. 11/11/11   [provider]    Family History Family History  Problem Relation Age of Onset  . Cancer Father        enviromental exposure  . Cancer Brother        enviromental exposure    Social History Social History   Tobacco Use  . Smoking status: Never Smoker  . Smokeless tobacco: Never Used  Substance Use Topics  . Alcohol use: No  . Drug use: No     Allergies   Latanoprost; Bactrim; Doxycycline; Tetracyclines & related; and Latex   Review of Systems Review of Systems   Physical Exam Triage Vital Signs ED Triage Vitals  Enc Vitals Group  BP 12/28/17 1624 (!) 147/56     Pulse Rate 12/28/17 1624 87     Resp 12/28/17 1624 16     Temp 12/28/17 1624 98.9 F (37.2 C)     Temp Source 12/28/17 1624 Oral     SpO2 12/28/17 1624 98 %     Weight --      Height --      Head Circumference --      Peak Flow --      Pain Score 12/28/17 1622 3     Pain Loc --      Pain Edu? --      Excl. in [place]? --    No data found.  Updated Vital Signs BP (!) 147/56 (BP Location: Left Arm)   Pulse 87   Temp 98.9 F (37.2 C) (Oral)   Resp 16   SpO2 98%    Tono Left eye 15   Visual Acuity Right Eye Distance:   Left Eye Distance:   Bilateral Distance:    Right Eye Near:   Left Eye Near:    Bilateral Near:     Physical Exam  Constitutional: She is oriented to person, place, and time. She appears well-developed and well-nourished. No distress.  HENT:  Head:    Scabbed  lesion with small amount of redness surrounding to left upper eye lid; without lesions to lower lid; without redness or swelling to lid; one small red lesion to left temple; pain to left temple. Forehead and into scalp to left side; see photo  Eyes: Conjunctivae are normal.  Slit lamp exam:      The left eye shows no fluorescein uptake.  Cardiovascular: Normal rate, regular rhythm and normal heart sounds.  Pulmonary/Chest: Effort normal and breath sounds normal.  Neurological: She is alert and oriented to person, place, and time.  Skin: Skin is warm and dry.       UC Treatments / Results  Labs (all labs ordered are listed, but only abnormal results are displayed) Labs Reviewed - No data to display  EKG  EKG Interpretation None       Radiology No results found.  Procedures Procedures (including critical care time)  Medications Ordered in UC Medications - No data to display   Initial Impression / Assessment and Plan / UC Course  I have reviewed the triage vital signs and the nursing notes.  Pertinent labs & imaging results that were available during my care of the patient were reviewed by me and considered in my medical decision making (see chart for details).     Lesion with left head pain, likely consistent with shingles; tono WNL to left eye; fluorescein without uptake; 1 week of symptoms. Denies vision changes. Visual acutiy without large discrepancy. Discussed case with Dr. Rolena Carney on call with [facility] ophthalmology, they will see patient early next week as low suspicion for current ocular involvement. Patient verbalized understanding and agreeable to plan.    Final Clinical Impressions(s) / UC Diagnoses   Final diagnoses:  Herpes zoster without complication    ED Discharge Orders    None       Controlled Substance Prescriptions [place] Controlled Substance Registry consulted? Not Applicable   Stephanie Gottron, NP 12/28/17 [phone]

## 2018-01-01 ENCOUNTER — Telehealth: Payer: Self-pay | Admitting: Internal Medicine

## 2018-01-01 NOTE — Telephone Encounter (Signed)
Not taking new patients sorry.  

## 2018-01-01 NOTE — Telephone Encounter (Signed)
LVM to inform patient Dr. Sharlet Salina is not accepting patients. She could become a patient with Gayla Medicus here at our office or look at one of the other locations.

## 2018-01-01 NOTE — Telephone Encounter (Signed)
Copied from [place] [phone]. Topic: Quick Communication - See Telephone Encounter >> Jan 01, 2018 11:55 AM Cleaster Corin, NT wrote: CRM for notification. See Telephone encounter for:   01/01/18. Pt. Calling and would like to est. Care with Pricilla Holm. Pt. Can be reached at [phone] leave vm)  [facility] ppo plus

## 2018-02-06 ENCOUNTER — Encounter: Payer: Self-pay | Admitting: Nurse Practitioner

## 2018-02-06 ENCOUNTER — Ambulatory Visit (INDEPENDENT_AMBULATORY_CARE_PROVIDER_SITE_OTHER): Payer: Medicare HMO | Admitting: Nurse Practitioner

## 2018-02-06 VITALS — BP 128/82 | HR 82 | Temp 98.2°F | Resp 16 | Ht 62.25 in | Wt 125.0 lb

## 2018-02-06 DIAGNOSIS — Z13220 Encounter for screening for lipoid disorders: Secondary | ICD-10-CM | POA: Diagnosis not present

## 2018-02-06 DIAGNOSIS — Z0001 Encounter for general adult medical examination with abnormal findings: Secondary | ICD-10-CM | POA: Diagnosis not present

## 2018-02-06 DIAGNOSIS — R011 Cardiac murmur, unspecified: Secondary | ICD-10-CM | POA: Insufficient documentation

## 2018-02-06 DIAGNOSIS — M8580 Other specified disorders of bone density and structure, unspecified site: Secondary | ICD-10-CM | POA: Diagnosis not present

## 2018-02-06 DIAGNOSIS — R32 Unspecified urinary incontinence: Secondary | ICD-10-CM | POA: Insufficient documentation

## 2018-02-06 DIAGNOSIS — L989 Disorder of the skin and subcutaneous tissue, unspecified: Secondary | ICD-10-CM | POA: Diagnosis not present

## 2018-02-06 DIAGNOSIS — R42 Dizziness and giddiness: Secondary | ICD-10-CM

## 2018-02-06 NOTE — Patient Instructions (Addendum)
Please head downstairs for lab work/x-rays.  Per guidelines, you do not need another PAP smear.  I have placed a referral to dermatology for your skin check and cardiology to follow up on your murmur. Our office will call you to schedule this appointment. You should hear from our office in 7-10 days.  For your bone health, please ensure an adequate intake of dietary calcium (1200 mg/d) and vitamin D (800 IU daily) unless contraindicated.  You can call for a nurse visit when you are ready to have your vaccinations.  If you have medicare related insurance (such as traditional Medicare, [employer], [employer], or similar), Please make an appointment at the scheduling desk with Sharee Pimple, the [employer], for your Wellness visit in this office, which is a benefit with your insurance.  Please return in 1 year for routine follow up, or sooner if needed.   Kegel Exercises Kegel exercises help strengthen the muscles that support the rectum, vagina, small intestine, bladder, and uterus. Doing Kegel exercises can help:  Improve bladder and bowel control.  Improve sexual response.  Reduce problems and discomfort during pregnancy.  Kegel exercises involve squeezing your pelvic floor muscles, which are the same muscles you squeeze when you try to stop the flow of urine. The exercises can be done while sitting, standing, or lying down, but it is best to vary your position. Phase 1 exercises 1. Squeeze your pelvic floor muscles tight. You should feel a tight lift in your rectal area. If you are a female, you should also feel a tightness in your vaginal area. Keep your stomach, buttocks, and legs relaxed. 2. Hold the muscles tight for up to 10 seconds. 3. Relax your muscles. Repeat this exercise 50 times a day or as many times as told by your health care provider. Continue to do this exercise for at least 4-6 weeks or for as long as told by your health care provider. This  information is not intended to replace advice given to you by your health care provider. Make sure you discuss any questions you have with your health care provider. Document Released: 10/23/2012 Document Revised: 07/01/2016 Document Reviewed: 09/26/2015 Elsevier Interactive Patient Education  [employer].

## 2018-02-06 NOTE — Assessment & Plan Note (Signed)
Continue OTC calcium and Vitamin D  Update bone density today - DG Bone Density; Future

## 2018-02-06 NOTE — Addendum Note (Signed)
Addended by: Delice Bison E on: 02/06/2018 04:24 PM   Modules accepted: Orders

## 2018-02-06 NOTE — Assessment & Plan Note (Signed)
Declines additional treatment or referral to urology today We discussed daily kegels -See AVS for additional information provided to patient She will F/U if symptoms worsen Will check for urine infection today - Urinalysis; Future - Urine Culture; Future

## 2018-02-06 NOTE — Assessment & Plan Note (Addendum)
-  USPSTF grade A and B recommendations reviewed with patient; age-appropriate recommendations, preventive care, screening tests, etc discussed and encouraged; healthy living encouraged; see AVS for patient education given to patient -Discussed importance of 150 minutes of physical activity weekly, eat 6 servings of fruit/vegetables daily and drink plenty of water and avoid sweet beverages.  -Red flags and when to present for emergency care or RTC including fever >101.23F, chest pain, shortness of breath, new/worsening/un-resolving symptoms, reviewed with patient at time of visit. Follow up and care instructions discussed and provided in AVS. -Reviewed Health Maintenance: she declines vaccines today- will schedule nurse visit to update at a later time We discussed routine PAP guidelines which do not recommend further PAP screenings for her  Screening for cholesterol level- Lipid panel; Future  Skin problem Requests annual skin check  - Ambulatory referral to Dermatology

## 2018-02-06 NOTE — Progress Notes (Signed)
Name: Stephanie Carney   MRN: [id number]    DOB: Feb 27, 1932   Date:02/06/2018       Progress Note  Subjective  Chief Complaint  Chief Complaint  Patient presents with  . Establish Care    CPE, not fasting    HPI Stephanie Carney is establishing care as a new patient to our practice today. She is currently being followed by oncology for malignant neoplasm of left areola in remission and opthalmology for primary open angle glaucoma. She would like an annual CPE today. She is an independent, active 84 yo that lives alone and is not currently maintained on any daily prescription medications aside from eye drops for her glaucoma.  Diet: admits she does not follow a good diet- eats salt, french fries, fast food regularly Exercise: walks three times a week   USPSTF grade A and B recommendations  Depression: No concerns for anxiety or depression today. Depression screen [facility] 2/9 02/06/2018  Decreased Interest 0  Down, Depressed, Hopeless 0  PHQ - 2 Score 0   Hypertension: BP Readings from Last 3 Encounters:  02/06/18 128/82  12/28/17 (!) 147/56  08/13/17 (!) 147/69   Obesity: Wt Readings from Last 3 Encounters:  02/06/18 125 lb (56.7 kg)  08/13/17 126 lb 9.6 oz (57.4 kg)  03/12/17 123 lb 9.6 oz (56.1 kg)   BMI Readings from Last 3 Encounters:  02/06/18 22.68 kg/m  08/13/17 22.79 kg/m  03/12/17 22.25 kg/m    Alcohol: denies Tobacco use: no, never HIV: declines Screening STD testing and prevention (chl/gon/syphilis): declines STD testing today- last sexual intercourse 1960 Intimate partner violence: denies Menstrual History/LMP/Abnormal Bleeding: post menopausal- no abnormal vaginal discharge or bleeding-followed with GYN until 2017 when she was told she no longer needs PAP but she wonders if this is right Incontinence Symptoms: YES- reports intermittent bladder leakage for about 10 years, does not feel the urge to go until too late She notices this a few times a week She denies  flank pain, dysuria, hematuria She was referred to urogynecology in the past for workup of incontinence but did not follow up  Vaccinations: declines today, will schedule on her own at a later date  Advanced Care Planning: A voluntary discussion about advance care planning including the explanation and discussion of advance directives. Discussed health care proxy and Living will, and the patient DOES NOT have a living will at present time. If patient does have living will, I have requested they bring this to the clinic to be scanned in to their chart.  Breast cancer: mammogram up to date Cervical cancer screening: cervical ca - 1965- discharged from GYN care in 2017  Osteoporosis: osteopenia noted on 2017 dexa, will repeat today -takes daily calcium and vitamin D OTC Fall prevention/vitamin D: no recent falls- we discussed fall prevention at home including appropriate footwear, adequate lighting and avoiding throw rugs or objects in the floor, using handrails  Lipids:  No results found for: CHOL No results found for: HDL No results found for: LDLCALC No results found for: TRIG No results found for: CHOLHDL No results found for: LDLDIRECT  Glucose:  Glucose  Date Value Ref Range Status  07/14/2015 88 70 - 140 mg/dl Final  04/16/2014 118 70 - 140 mg/dl Final  03/06/2013 113 (H) 70 - 99 mg/dl Final   Glucose, Bld  Date Value Ref Range Status  02/06/2012 86 70 - 99 mg/dL Final  04/13/2011 107 (H) 70 - 99 mg/dL Final   Glucose-Capillary  Date Value Ref Range Status  02/16/2010 97 70 - 99 mg/dL Final    Skin cancer: requests dermatology referral for annual skin check Colorectal cancer: no personal or family history of colon cancer, no constipation or diarrhea, no rectal bleeding  Aspirin: takes 81 mg daily ECG: not indicated   Patient Active Problem List   Diagnosis Date Noted  . Malignant neoplasm of areola of left breast in female, estrogen receptor positive ([place])  07/14/2015  . Thyroid nodule 06/19/2013  . Pain in joint, lower leg 06/19/2013    Past Surgical History:  Procedure Laterality Date  . BREAST LUMPECTOMY  02/2011   left  . CERVICAL CONE BIOPSY    . LEG SURGERY  may 2011  . SPINE SURGERY    . UTERINE SUSPENSION     Growth/mass removal    Family History  Problem Relation Age of Onset  . Cancer Father        enviromental exposure  . Cancer Brother        enviromental exposure    Social History   Socioeconomic History  . Marital status: Divorced    Spouse name: Not on file  . Number of children: Not on file  . Years of education: Not on file  . Highest education level: Not on file  Social Needs  . Financial resource strain: Not on file  . Food insecurity - worry: Not on file  . Food insecurity - inability: Not on file  . Transportation needs - medical: Not on file  . Transportation needs - non-medical: Not on file  Occupational History  . Not on file  Tobacco Use  . Smoking status: Never Smoker  . Smokeless tobacco: Never Used  Substance and Sexual Activity  . Alcohol use: No  . Drug use: No  . Sexual activity: Yes    Birth control/protection: Post-menopausal  Other Topics Concern  . Not on file  Social History Narrative  . Not on file     Current Outpatient Medications:  .  aspirin 81 MG tablet, Take 81 mg by mouth daily.  , Disp: , Rfl:  .  AZOPT 1 % ophthalmic suspension, BID times 48H., Disp: , Rfl:  .  betaxolol (BETOPTIC-S) 0.25 % ophthalmic suspension, 1 drop 2 (two) times daily.  , Disp: , Rfl:  .  COD LIVER OIL PO, Take by mouth., Disp: , Rfl:  .  MULTIPLE VITAMIN PO, Take 1 tablet by mouth daily., Disp: , Rfl:  .  Multiple Vitamins-Minerals (EYE VITAMINS) CAPS, Take by mouth., Disp: , Rfl:  .  Vitamin Mixture (ESTER-C PO), Take by mouth., Disp: , Rfl:  .  XALATAN 0.005 % ophthalmic solution, daily., Disp: , Rfl:   Allergies  Allergen Reactions  . Latanoprost Other (See Comments)    Asthma,  generic gives her asthma, brand name does not  . Bactrim Nausea And Vomiting  . Doxycycline Nausea And Vomiting    Glaucoma damage And headache  . Tetracyclines & Related Nausea And Vomiting    And headache  . Latex Rash    welts     ROS  Constitutional: Negative for fever or weight change.  Respiratory: Negative for cough and shortness of breath.   Cardiovascular: Negative for chest pain or palpitations.  Gastrointestinal: Negative for abdominal pain, no bowel changes.  Musculoskeletal: Negative for gait problem or joint swelling.  Skin: Negative for rash.  Neurological: Negative for weakness, confusion, headache. Positive for dizziness. No other specific complaints in a complete  review of systems (except as listed in HPI above).  Dizziness- occasionally feels dizzy, denies syncope or falls  Objective  Vitals:   02/06/18 0900  BP: 128/82  Pulse: 82  Resp: 16  Temp: 98.2 F (36.8 C)  TempSrc: Oral  SpO2: 97%  Weight: 125 lb (56.7 kg)  Height: 5' 2.25" (1.581 m)    Body mass index is 22.68 kg/m.  Physical Exam Vital signs reviewed. Constitutional: Patient appears well-developed and well-nourished. No distress.  HENT: Head: Normocephalic and atraumatic. Ears: B TMs ok, no erythema or effusion; Nose: Nose normal. Mouth/Throat: Oropharynx is clear and moist. No oropharyngeal exudate.  Eyes: Conjunctivae and EOM are normal. Pupils are equal, round, and reactive to light. No scleral icterus.  Neck: Normal range of motion. Neck supple. No JVD present. No thyromegaly present. no cervical adenopathy. Cardiovascular: Normal rate, regular rhythm. Murmur heard. No BLE edema. Pulmonary/Chest: Effort normal and breath sounds normal. No respiratory distress. Abdominal: Soft. Bowel sounds are normal, no distension. There is no tenderness. no masses Musculoskeletal: Normal range of motion, no joint effusions. No gross deformities Neurological: she is alert and oriented to person,  place, and time. No cranial nerve deficit. Coordination, balance, strength, speech and gait are normal.  Skin: Skin is warm and dry. No rash noted. No erythema.  Psychiatric: Patient has a normal mood and affect. behavior is normal. Judgment and thought content normal.  Fall Risk: Fall Risk  02/06/2018  Falls in the past year? No    Assessment & Plan RTC in 1 year for CPE Recommended wellness with Sharee Pimple  Dizziness Normal VS and PE today. Instructed to F/U if no improvement or worsening of symptoms - Comprehensive metabolic panel; Future - CBC with Differential/Platelet; Future -TSH- Future - Ambulatory referral to Cardiology

## 2018-02-06 NOTE — Assessment & Plan Note (Signed)
Not a new finding She says her heart murmur has been present for some time She has multiple questions about her cardiac history as well as telling me that she has found erroneous entries about cardiac procedures in her medical record We dicussed referral to cardiology for further evaluation and discussion and she is agreeable - Ambulatory referral to Cardiology

## 2018-02-08 ENCOUNTER — Ambulatory Visit: Payer: Medicare HMO | Admitting: Family Medicine

## 2018-02-12 ENCOUNTER — Other Ambulatory Visit (INDEPENDENT_AMBULATORY_CARE_PROVIDER_SITE_OTHER): Payer: Medicare HMO

## 2018-02-12 DIAGNOSIS — Z0001 Encounter for general adult medical examination with abnormal findings: Secondary | ICD-10-CM

## 2018-02-12 DIAGNOSIS — R32 Unspecified urinary incontinence: Secondary | ICD-10-CM | POA: Diagnosis not present

## 2018-02-12 DIAGNOSIS — Z13220 Encounter for screening for lipoid disorders: Secondary | ICD-10-CM | POA: Diagnosis not present

## 2018-02-12 DIAGNOSIS — R42 Dizziness and giddiness: Secondary | ICD-10-CM | POA: Diagnosis not present

## 2018-02-12 LAB — LIPID PANEL
Cholesterol: 200 mg/dL (ref 0–200)
HDL: 60.7 mg/dL (ref >39.00)
LDL CALC: 125 mg/dL — AB (ref 0–99)
NonHDL: 139.29
Total CHOL/HDL Ratio: 3
Triglycerides: 73 mg/dL (ref 0.0–149.0)
VLDL: 14.6 mg/dL (ref 0.0–40.0)

## 2018-02-12 LAB — COMPREHENSIVE METABOLIC PANEL
ALT: 18 U/L (ref 0–35)
AST: 20 U/L (ref 0–37)
Albumin: 3.9 g/dL (ref 3.5–5.2)
Alkaline Phosphatase: 56 U/L (ref 39–117)
BUN: 17 mg/dL (ref 6–23)
CHLORIDE: 105 meq/L (ref 96–112)
CO2: 28 mEq/L (ref 19–32)
Calcium: 9.4 mg/dL (ref 8.4–10.5)
Creatinine, Ser: 0.76 mg/dL (ref 0.40–1.20)
GFR: 77.11 mL/min (ref >60.00)
Glucose, Bld: 121 mg/dL — ABNORMAL HIGH (ref 70–99)
POTASSIUM: 4.3 meq/L (ref 3.5–5.1)
SODIUM: 139 meq/L (ref 135–145)
Total Bilirubin: 0.6 mg/dL (ref 0.2–1.2)
Total Protein: 7.2 g/dL (ref 6.0–8.3)

## 2018-02-12 LAB — CBC WITH DIFFERENTIAL/PLATELET
BASOS PCT: 0.6 % (ref 0.0–3.0)
Basophils Absolute: 0 10*3/uL (ref 0.0–0.1)
EOS PCT: 2.2 % (ref 0.0–5.0)
Eosinophils Absolute: 0.2 10*3/uL (ref 0.0–0.7)
HCT: 40.7 % (ref 36.0–46.0)
Hemoglobin: 13.5 g/dL (ref 12.0–15.0)
Lymphocytes Relative: 32.5 % (ref 12.0–46.0)
Lymphs Abs: 2.3 10*3/uL (ref 0.7–4.0)
MCHC: 33.2 g/dL (ref 30.0–36.0)
MCV: 93.4 fl (ref 78.0–100.0)
Monocytes Absolute: 0.6 10*3/uL (ref 0.1–1.0)
Monocytes Relative: 8.6 % (ref 3.0–12.0)
Neutro Abs: 3.9 10*3/uL (ref 1.4–7.7)
Neutrophils Relative %: 56.1 % (ref 43.0–77.0)
Platelets: 272 10*3/uL (ref 150.0–400.0)
RBC: 4.36 Mil/uL (ref 3.87–5.11)
RDW: 14.5 % (ref 11.5–15.5)
WBC: 7 10*3/uL (ref 4.0–10.5)

## 2018-02-12 LAB — URINALYSIS, ROUTINE W REFLEX MICROSCOPIC
Bilirubin Urine: NEGATIVE
Nitrite: NEGATIVE
Specific Gravity, Urine: >=1.03 — AB (ref 1.000–1.030)
Urine Glucose: NEGATIVE
Urobilinogen, UA: 0.2 (ref 0.0–1.0)
pH: 5.5 (ref 5.0–8.0)

## 2018-02-12 LAB — TSH: TSH: 6.05 u[IU]/mL — ABNORMAL HIGH (ref 0.35–4.50)

## 2018-02-14 LAB — URINE CULTURE
MICRO NUMBER:: 90376266
SPECIMEN QUALITY:: ADEQUATE

## 2018-02-18 ENCOUNTER — Other Ambulatory Visit: Payer: Self-pay | Admitting: Nurse Practitioner

## 2018-02-18 DIAGNOSIS — R946 Abnormal results of thyroid function studies: Secondary | ICD-10-CM

## 2018-02-18 MED ORDER — CIPROFLOXACIN HCL 500 MG PO TABS: 500.0000 mg | ORAL_TABLET | Freq: Every day | ORAL | 0 refills | Status: DC

## 2018-03-12 DIAGNOSIS — H401133 Primary open-angle glaucoma, bilateral, severe stage: Secondary | ICD-10-CM | POA: Diagnosis not present

## 2018-03-18 ENCOUNTER — Encounter: Payer: Self-pay | Admitting: Nurse Practitioner

## 2018-04-02 ENCOUNTER — Ambulatory Visit (INDEPENDENT_AMBULATORY_CARE_PROVIDER_SITE_OTHER)
Admission: RE | Admit: 2018-04-02 | Discharge: 2018-04-02 | Disposition: A | Discharge status: Home / Self Care | Payer: Medicare HMO | Source: Ambulatory Visit | Attending: Nurse Practitioner | Admitting: Nurse Practitioner

## 2018-04-02 ENCOUNTER — Other Ambulatory Visit (INDEPENDENT_AMBULATORY_CARE_PROVIDER_SITE_OTHER): Payer: Medicare HMO

## 2018-04-02 DIAGNOSIS — M8580 Other specified disorders of bone density and structure, unspecified site: Secondary | ICD-10-CM

## 2018-04-02 DIAGNOSIS — R42 Dizziness and giddiness: Secondary | ICD-10-CM | POA: Diagnosis not present

## 2018-04-02 DIAGNOSIS — R32 Unspecified urinary incontinence: Secondary | ICD-10-CM

## 2018-04-02 DIAGNOSIS — M85852 Other specified disorders of bone density and structure, left thigh: Secondary | ICD-10-CM

## 2018-04-02 LAB — URINALYSIS, ROUTINE W REFLEX MICROSCOPIC
Bilirubin Urine: NEGATIVE
Nitrite: NEGATIVE
Total Protein, Urine: NEGATIVE
URINE GLUCOSE: NEGATIVE
UROBILINOGEN UA: 0.2 (ref 0.0–1.0)
pH: 5.5 (ref 5.0–8.0)

## 2018-04-04 LAB — URINE CULTURE
MICRO NUMBER:: 90586134
SPECIMEN QUALITY:: ADEQUATE

## 2018-04-10 ENCOUNTER — Other Ambulatory Visit: Payer: Self-pay | Admitting: Family

## 2018-04-10 ENCOUNTER — Encounter: Payer: Self-pay | Admitting: Family

## 2018-04-10 DIAGNOSIS — M810 Age-related osteoporosis without current pathological fracture: Secondary | ICD-10-CM

## 2018-04-10 HISTORY — DX: Age-related osteoporosis without current pathological fracture: M81.0

## 2018-04-10 MED ORDER — CALCIUM CARBONATE-VITAMIN D 600-400 MG-UNIT PO TABS: 1.0000 | ORAL_TABLET | Freq: Two times a day (BID) | ORAL | Status: DC

## 2018-04-10 NOTE — Telephone Encounter (Signed)
Bone density shows osteoporosis. I recommend that pt start calcium in the form of of caltrate 600mg  + D one tablet by mouth twice daily. This is available over the counter.  In addition, please ensure regular weight bearing exercise such as walking.  I would also recommend that she start fosamax once weekly. Take in AM on empty stomach and sit upright x 90 minutes after taking.  This is to help decrease risk of fracture. Rx has been pended.

## 2018-04-10 NOTE — Telephone Encounter (Signed)
Also, urine shows bacterial infection.  I don't see that she has been given antibiotics yet for this.  Please begin cipro 250mg  bid. Call if symptoms worsen or fail to improve.

## 2018-04-11 NOTE — Telephone Encounter (Signed)
LVM for pt to call back for results.

## 2018-04-12 NOTE — Telephone Encounter (Signed)
Patient called back for [facility]. Please call back

## 2018-04-16 MED ORDER — ALENDRONATE SODIUM 70 MG PO TABS: 70.0000 mg | ORAL_TABLET | ORAL | 5 refills | Status: DC

## 2018-04-16 MED ORDER — CEFUROXIME AXETIL 250 MG PO TABS: 250.0000 mg | ORAL_TABLET | Freq: Two times a day (BID) | ORAL | 0 refills | Status: DC

## 2018-04-16 NOTE — Telephone Encounter (Signed)
I have sent a prescription for ceftin 250mg  twice daily for 7 days. We are limited to additional treatment options due to her medication allergies so I would recommend she try the ceftin. Please follow up for new or worsening symptoms or no improvement after ceftin

## 2018-04-16 NOTE — Telephone Encounter (Signed)
Pt aware of response below.  

## 2018-04-16 NOTE — Telephone Encounter (Signed)
Pt aware of results. Pt would like an alternative medication besides cipro and macrobid. Pt states she does not tolerate either antibiotic well. Please advise.

## 2018-04-16 NOTE — Telephone Encounter (Signed)
LVM for pt to call back.

## 2018-05-31 ENCOUNTER — Ambulatory Visit: Payer: Medicare HMO | Admitting: Cardiovascular Disease

## 2018-06-05 DIAGNOSIS — L821 Other seborrheic keratosis: Secondary | ICD-10-CM | POA: Diagnosis not present

## 2018-06-05 DIAGNOSIS — L723 Sebaceous cyst: Secondary | ICD-10-CM | POA: Diagnosis not present

## 2018-06-05 DIAGNOSIS — L905 Scar conditions and fibrosis of skin: Secondary | ICD-10-CM | POA: Diagnosis not present

## 2018-06-13 ENCOUNTER — Encounter: Payer: Self-pay | Admitting: Cardiovascular Disease

## 2018-06-13 ENCOUNTER — Ambulatory Visit: Payer: Medicare HMO | Admitting: Cardiovascular Disease

## 2018-06-13 VITALS — BP 146/64 | HR 82 | Ht 62.25 in | Wt 123.0 lb

## 2018-06-13 DIAGNOSIS — R0989 Other specified symptoms and signs involving the circulatory and respiratory systems: Secondary | ICD-10-CM

## 2018-06-13 DIAGNOSIS — R011 Cardiac murmur, unspecified: Secondary | ICD-10-CM | POA: Diagnosis not present

## 2018-06-13 DIAGNOSIS — R9431 Abnormal electrocardiogram [ECG] [EKG]: Secondary | ICD-10-CM

## 2018-06-13 DIAGNOSIS — E7800 Pure hypercholesterolemia, unspecified: Secondary | ICD-10-CM
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
## 2018-06-13 NOTE — Patient Instructions (Signed)
Medication Instructions:  Your physician recommends that you continue on your current medications as directed. Please refer to the Current Medication list given to you today.   Labwork: none  Testing/Procedures: Your physician has requested that you have a carotid duplex. This test is an ultrasound of the carotid arteries in your neck. It looks at blood flow through these arteries that supply the brain with blood. Allow one hour for this exam. There are no restrictions or special instructions.  Your physician has requested that you have an echocardiogram. Echocardiography is a painless test that uses sound waves to create images of your heart. It provides your doctor with information about the size and shape of your heart and how well your heart's chambers and valves are working. This procedure takes approximately one hour. There are no restrictions for this procedure.    Follow-Up: Follow up with Dr. Sallyanne Kuster as needed.  We will follow up with you on the results of your testing.    Any Other Special Instructions Will Be Listed Below (If Applicable).     If you need a refill on your cardiac medications before your next appointment, please call your pharmacy.

## 2018-06-18 ENCOUNTER — Ambulatory Visit (HOSPITAL_COMMUNITY)
Admission: RE | Admit: 2018-06-18 | Discharge: 2018-06-18 | Disposition: A | Discharge status: Home / Self Care | Payer: Medicare HMO | Source: Ambulatory Visit | Attending: Cardiology | Admitting: Cardiology

## 2018-06-18 DIAGNOSIS — R0989 Other specified symptoms and signs involving the circulatory and respiratory systems: Secondary | ICD-10-CM | POA: Insufficient documentation

## 2018-06-20 ENCOUNTER — Ambulatory Visit (HOSPITAL_COMMUNITY): Payer: Medicare HMO | Attending: Cardiology

## 2018-06-20 ENCOUNTER — Other Ambulatory Visit: Payer: Self-pay

## 2018-06-20 DIAGNOSIS — Z853 Personal history of malignant neoplasm of breast: Secondary | ICD-10-CM | POA: Insufficient documentation

## 2018-06-20 DIAGNOSIS — R011 Cardiac murmur, unspecified: Secondary | ICD-10-CM | POA: Diagnosis not present

## 2018-06-20 DIAGNOSIS — I050 Rheumatic mitral stenosis: Secondary | ICD-10-CM | POA: Insufficient documentation

## 2018-07-05 ENCOUNTER — Telehealth: Payer: Self-pay | Admitting: Cardiovascular Disease

## 2018-07-05 NOTE — Telephone Encounter (Signed)
Message not needed. °

## 2018-07-16 DIAGNOSIS — Z961 Presence of intraocular lens: Secondary | ICD-10-CM | POA: Diagnosis not present

## 2018-07-16 DIAGNOSIS — H401133 Primary open-angle glaucoma, bilateral, severe stage: Secondary | ICD-10-CM | POA: Diagnosis not present

## 2018-07-23 ENCOUNTER — Other Ambulatory Visit: Payer: Self-pay | Admitting: Oncology

## 2018-07-23 DIAGNOSIS — Z1231 Encounter for screening mammogram for malignant neoplasm of breast: Secondary | ICD-10-CM

## 2018-07-30 ENCOUNTER — Telehealth: Payer: Self-pay

## 2018-07-30 ENCOUNTER — Ambulatory Visit (INDEPENDENT_AMBULATORY_CARE_PROVIDER_SITE_OTHER): Payer: Medicare HMO

## 2018-07-30 DIAGNOSIS — Z23 Encounter for immunization: Secondary | ICD-10-CM | POA: Diagnosis not present

## 2018-07-30 DIAGNOSIS — Z299 Encounter for prophylactic measures, unspecified: Secondary | ICD-10-CM

## 2018-07-30 NOTE — Telephone Encounter (Signed)
In the absence of Ashleigh today, I have received verbal Ok order to administer Prevnar 13 pneumonia vaccine per patient request from Jodi Mourning, NP

## 2018-08-13 ENCOUNTER — Ambulatory Visit: Payer: Medicare HMO | Admitting: Adult Health

## 2018-08-20 ENCOUNTER — Ambulatory Visit
Admission: RE | Admit: 2018-08-20 | Discharge: 2018-08-20 | Disposition: A | Discharge status: Home / Self Care | Payer: Medicare HMO | Source: Ambulatory Visit | Attending: Oncology | Admitting: Oncology

## 2018-08-20 ENCOUNTER — Ambulatory Visit: Payer: Medicare HMO

## 2018-08-20 DIAGNOSIS — Z1231 Encounter for screening mammogram for malignant neoplasm of breast: Secondary | ICD-10-CM

## 2018-09-05 ENCOUNTER — Telehealth: Payer: Self-pay | Admitting: Adult Health

## 2018-09-05 ENCOUNTER — Encounter: Payer: Self-pay | Admitting: Adult Health

## 2018-09-05 ENCOUNTER — Inpatient Hospital Stay: Payer: Medicare HMO | Attending: Adult Health | Admitting: Adult Health

## 2018-09-05 VITALS — BP 149/56 | HR 90 | Temp 97.9°F | Resp 18 | Ht 62.25 in | Wt 123.8 lb

## 2018-09-05 DIAGNOSIS — M8580 Other specified disorders of bone density and structure, unspecified site: Secondary | ICD-10-CM | POA: Diagnosis not present

## 2018-09-05 DIAGNOSIS — Z170 Estrogen receptor positive status [ER+]: Secondary | ICD-10-CM | POA: Insufficient documentation

## 2018-09-05 DIAGNOSIS — Z853 Personal history of malignant neoplasm of breast: Secondary | ICD-10-CM | POA: Diagnosis not present

## 2018-09-05 DIAGNOSIS — C50012 Malignant neoplasm of nipple and areola, left female breast: Secondary | ICD-10-CM

## 2018-09-05 NOTE — Patient Instructions (Signed)
Bone Health Bones protect organs, store calcium, and anchor muscles. Good health habits, such as eating nutritious foods and exercising regularly, are important for maintaining healthy bones. They can also help to prevent a condition that causes bones to lose density and become weak and brittle (osteoporosis). Why is bone mass important? Bone mass refers to the amount of bone tissue that you have. The higher your bone mass, the stronger your bones. An important step toward having healthy bones throughout life is to have strong and dense bones during childhood. A young adult who has a high bone mass is more likely to have a high bone mass later in life. Bone mass at its greatest it is called peak bone mass. A large decline in bone mass occurs in older adults. In women, it occurs about the time of menopause. During this time, it is important to practice good health habits, because if more bone is lost than what is replaced, the bones will become less healthy and more likely to break (fracture). If you find that you have a low bone mass, you may be able to prevent osteoporosis or further bone loss by changing your diet and lifestyle. How can I find out if my bone mass is low? Bone mass can be measured with an X-ray test that is called a bone mineral density (BMD) test. This test is recommended for all women who are age 65 or older. It may also be recommended for men who are age 70 or older, or for people who are more likely to develop osteoporosis due to:  Having bones that break easily.  Having a long-term disease that weakens bones, such as kidney disease or rheumatoid arthritis.  Having menopause earlier than normal.  Taking medicine that weakens bones, such as steroids, thyroid hormones, or hormone treatment for breast cancer or prostate cancer.  Smoking.  Drinking three or more alcoholic drinks each day.  What are the nutritional recommendations for healthy bones? To have healthy bones, you  need to get enough of the right minerals and vitamins. Most nutrition experts recommend getting these nutrients from the foods that you eat. Nutritional recommendations vary from person to person. Ask your health care provider what is healthy for you. Here are some general guidelines. Calcium Recommendations Calcium is the most important (essential) mineral for bone health. Most people can get enough calcium from their diet, but supplements may be recommended for people who are at risk for osteoporosis. Good sources of calcium include:  Dairy products, such as low-fat or nonfat milk, cheese, and yogurt.  Dark green leafy vegetables, such as bok choy and broccoli.  Calcium-fortified foods, such as orange juice, cereal, bread, soy beverages, and tofu products.  Nuts, such as almonds.  Follow these recommended amounts for daily calcium intake:  Children, age 1?3: 700 mg.  Children, age 4?8: 1,000 mg.  Children, age 9?13: 1,300 mg.  Teens, age 14?18: 1,300 mg.  Adults, age 19?50: 1,000 mg.  Adults, age 51?70: ? Men: 1,000 mg. ? Women: 1,200 mg.  Adults, age 71 or older: 1,200 mg.  Pregnant and breastfeeding females: ? Teens: 1,300 mg. ? Adults: 1,000 mg.  Vitamin D Recommendations Vitamin D is the most essential vitamin for bone health. It helps the body to absorb calcium. Sunlight stimulates the skin to make vitamin D, so be sure to get enough sunlight. If you live in a cold climate or you do not get outside often, your health care provider may recommend that you take vitamin   D supplements. Good sources of vitamin D in your diet include:  Egg yolks.  Saltwater fish.  Milk and cereal fortified with vitamin D.  Follow these recommended amounts for daily vitamin D intake:  Children and teens, age 1?18: 600 international units.  Adults, age 50 or younger: 400-800 international units.  Adults, age 51 or older: 800-1,000 international units.  Other Nutrients Other nutrients  for bone health include:  Phosphorus. This mineral is found in meat, poultry, dairy foods, nuts, and legumes. The recommended daily intake for adult men and adult women is 700 mg.  Magnesium. This mineral is found in seeds, nuts, dark green vegetables, and legumes. The recommended daily intake for adult men is 400?420 mg. For adult women, it is 310?320 mg.  Vitamin K. This vitamin is found in green leafy vegetables. The recommended daily intake is 120 mg for adult men and 90 mg for adult women.  What type of physical activity is best for building and maintaining healthy bones? Weight-bearing and strength-building activities are important for building and maintaining peak bone mass. Weight-bearing activities cause muscles and bones to work against gravity. Strength-building activities increases muscle strength that supports bones. Weight-bearing and muscle-building activities include:  Walking and hiking.  Jogging and running.  Dancing.  Gym exercises.  Lifting weights.  Tennis and racquetball.  Climbing stairs.  Aerobics.  Adults should get at least 30 minutes of moderate physical activity on most days. Children should get at least 60 minutes of moderate physical activity on most days. Ask your health care provide what type of exercise is best for you. Where can I find more information? For more information, check out the following websites:  National Osteoporosis Foundation: http://nof.org/learn/basics  National Institutes of Health: http://www.niams.nih.gov/Health_Info/Bone/Bone_Health/bone_health_for_life.asp  This information is not intended to replace advice given to you by your health care provider. Make sure you discuss any questions you have with your health care provider. Document Released: 01/27/2004 Document Revised: 05/26/2016 Document Reviewed: 11/11/2014 Elsevier Interactive Patient Education  2018 Elsevier Inc.  

## 2018-09-05 NOTE — Progress Notes (Signed)
CLINIC:  Survivorship   REASON FOR VISIT:  Routine follow-up for history of breast cancer.   BRIEF ONCOLOGIC HISTORY:  Copied from Dr. 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  She has had some issues this year. Her medicare AWV was different than what she had anticipated.  She was disappointed with this.  She underwent bone density testing and was diagnosed with osteopenia.  Her T score was -1.1 in her left femur.  She was unsure about her calcium intake, and she is getting dietary calcium and calcium in her other vitamins.  She is doing well otherwise and as noted below a ROS was non contributory.   REVIEW OF SYSTEMS:  Review of Systems  Constitutional: Negative for appetite change, chills, fatigue, fever and unexpected weight change.  HENT:   Negative for hearing loss and lump/mass.   Eyes: Negative for eye problems and icterus.  Respiratory: Negative for chest tightness, cough and shortness of breath.   Cardiovascular: Negative for chest pain, leg swelling and palpitations.  Gastrointestinal: Negative for abdominal distention, abdominal pain, constipation, diarrhea, nausea and vomiting.  Endocrine: Negative for hot flashes.  Musculoskeletal: Negative for arthralgias.  Skin: Negative for itching and rash.  Neurological: Negative for dizziness, extremity weakness, headaches and numbness.  Hematological: Negative for adenopathy. Does not bruise/bleed easily.  Psychiatric/Behavioral: Negative for depression. The patient is not nervous/anxious.   Breast: Denies any new nodularity,  masses, tenderness, nipple changes, or nipple discharge.       PAST MEDICAL/SURGICAL HISTORY:  Past Medical History:  Diagnosis Date  . Acid reflux 02/10/2011  . Arthritis   . Asthma   . Breast cancer ([facility]) 02/10/2011   left breast  . Bruises easily 02/10/2011  . Cancer ([place])   . Cervical cancer ([place]) 02/10/2011  . Diabetes mellitus   . Fibromyalgia 02/10/2011  . Glaucoma   . Goiter 2014  . Joint pain 02/10/2011  . Night sweats 02/10/2011  . Osteoporosis 04/10/2018  . Shingles   . Weight increase 02/10/2011   Past Surgical History:  Procedure Laterality Date  . BREAST LUMPECTOMY  02/2011   left  . CERVICAL CONE BIOPSY    . LEG SURGERY  may 2011  . SPINE SURGERY    . UTERINE SUSPENSION     Growth/mass removal     ALLERGIES:  Allergies  Allergen Reactions  . Latanoprost Other (See Comments)    Asthma, generic gives her asthma, brand name does not  . Bactrim Nausea And Vomiting  . Doxycycline Nausea And Vomiting    Glaucoma damage And headache  . Tetracyclines & Related Nausea And Vomiting    And headache  . Ciprofloxacin     Muscle pain  . Macrobid [Nitrofurantoin Macrocrystal]     Muscle pain  . Latex Rash    welts     CURRENT MEDICATIONS:  Outpatient Encounter Medications as of 09/05/2018  Medication Sig  . aspirin 81 MG tablet Take 81 mg by mouth daily.    . AZOPT 1 % ophthalmic suspension BID times 48H.  . betaxolol (BETOPTIC-S) 0.25 % ophthalmic suspension 1 drop 2 (two) times daily.    Marland Kitchen  COD LIVER OIL PO Take by mouth.  . MULTIPLE VITAMIN PO Take 1 tablet by mouth daily.  . Multiple Vitamins-Minerals (EYE VITAMINS) CAPS Take by mouth.  . Vitamin Mixture (ESTER-C PO) Take by mouth.  Marland Kitchen XALATAN 0.005 % ophthalmic solution daily.   No facility-administered encounter medications on file as of 09/05/2018.      ONCOLOGIC FAMILY HISTORY:  Family History  Problem Relation Age of Onset  . Cancer Father        enviromental exposure  . Cancer Brother         enviromental exposure     SOCIAL HISTORY:  Stephanie Carney is divorced and lives alone in [place], [place].   Stephanie Carney is currently retired  She denies any current or history of tobacco, alcohol, or illicit drug use.  (reviewed 08/2018)   PHYSICAL EXAMINATION:  Vital Signs: Vitals:   09/05/18 1300  BP: (!) 149/56  Pulse: 90  Resp: 18  Temp: 97.9 F (36.6 C)  SpO2: 98%   Filed Weights   09/05/18 1300  Weight: 123 lb 12.8 oz (56.2 kg)   General: Well-nourished, well-appearing female in no acute distress.  Unaccompanied today.   HEENT: Head is normocephalic.  Pupils equal and reactive to light. Conjunctivae clear without exudate.  Sclerae anicteric. Oral mucosa is pink, moist.  Oropharynx is pink without lesions or erythema.  Lymph: No cervical, supraclavicular, or infraclavicular lymphadenopathy noted on palpation.  Cardiovascular: Regular rate and rhythm.Marland Kitchen Respiratory: Clear to auscultation bilaterally. Chest expansion symmetric; breathing non-labored.  Breast Exam:  -Left breast: No appreciable masses on palpation. No skin redness, thickening, or peau d'orange appearance; no nipple retraction or nipple discharge; mild distortion in symmetry at previous lumpectomy site well healed scar without erythema or nodularity.  -Right breast: No appreciable masses on palpation. No skin redness, thickening, or peau d'orange appearance; no nipple retraction or nipple discharge;  -Axilla: No axillary adenopathy bilaterally.  GI: Abdomen soft and round; non-tender, non-distended. Bowel sounds normoactive. No hepatosplenomegaly.   GU: Deferred.  Neuro: No focal deficits. Steady gait.  Psych: Mood and affect normal and appropriate for situation.  MSK: No focal spinal tenderness to palpation, full range of motion in bilateral upper extremities Extremities: No edema. Skin: Warm and dry.  LABORATORY DATA:  None for this visit   DIAGNOSTIC IMAGING:  Most recent mammogram:      ASSESSMENT AND PLAN:  Ms.. Carney is a pleasant 84 y.o. female with history of Stage 0 left breast DCIS, ER+/PR+, diagnosed in 01/2011, treated with lumpectomy alone.  She [facility] for surveillance and routine follow-up.   1. History of breast cancer:  Stephanie Carney is currently clinically and radiographically without evidence of disease or recurrence of breast cancer. She will be due for mammogram in 08/2019; orders placed today. I offered her the opportunity to graduate today from long term f/u, however she declined and will return in one year for f/u.  We are happy to accommodate this.  I encouraged her to call me with any questions or concerns before her next visit at the cancer center, and I would be happy to see her sooner, if needed.    2. Bone health:  Given Stephanie Carney's age, history of breast cancer, she is at risk for bone demineralization. She and I reviewed her recent bone density testing that showed osteopenia with a t score of -1.1.  This is early osteopenia.  We reviewed her calcium and vitamin d intake.  It sounds like she is getting sufficient amounts of these.  I gave her a detailed handout on bone health that details calcium, vitamin d, and weight bearing exercises.  She is going to work on improving her weight bearing exercises.  3. Cancer screening:  Due to Stephanie Carney's history and her age, she should receive screening for skin cancers. She was encouraged to follow-up with her PCP for appropriate cancer screenings.   4. Health maintenance and wellness promotion: Stephanie Carney was encouraged to consume 5-7 servings of fruits and vegetables per day. She was also encouraged to engage in moderate to vigorous exercise for 30 minutes per day most days of the week. She was instructed to limit her alcohol consumption and continue to abstain from tobacco use.    Dispo:  -Return to cancer center in one year for LTS follow up -Mammogram due in 08/2019   A total of  (30) minutes of face-to-face time was spent with this patient with greater than 50% of that time in counseling and care-coordination.   Gardenia Phlegm, NP Survivorship Program [facility] [phone]   Note: PRIMARY CARE PROVIDER Lance Sell, [place] [phone]

## 2018-09-05 NOTE — Telephone Encounter (Signed)
Made appt for LTS for 2020.  Mailed calendar.  Did not reach patient.

## 2018-09-05 NOTE — Telephone Encounter (Signed)
Patient stopped by and got her calendar for 2020.

## 2018-11-22 DIAGNOSIS — H401133 Primary open-angle glaucoma, bilateral, severe stage: Secondary | ICD-10-CM | POA: Diagnosis not present

## 2018-11-22 DIAGNOSIS — Z961 Presence of intraocular lens: Secondary | ICD-10-CM | POA: Diagnosis not present

## 2019-02-13 ENCOUNTER — Encounter: Payer: Medicare HMO | Admitting: Nurse Practitioner

## 2019-04-07 ENCOUNTER — Telehealth: Payer: Self-pay

## 2019-04-07 NOTE — Telephone Encounter (Signed)
Fine

## 2019-04-07 NOTE — Telephone Encounter (Signed)
Copied from [place] [id number]. Topic: Appointment Scheduling - Scheduling Inquiry for Clinic >> Apr 07, 2019  3:23 PM Stephanie Carney wrote: Reason for CRM: pt called in and stated that she Carney cyst in the middle of her chest that now as developed Carney rash around it, she would like to see if she could be seen.  Per office will have to send to Dr Sharlet Salina to see if she would be able to get her in?    Best number [phone]

## 2019-04-07 NOTE — Telephone Encounter (Signed)
Patient does not have any covid symptoms is it okay to come in and have a visit to show you the cyst? Covid risk 3.

## 2019-04-07 NOTE — Telephone Encounter (Signed)
Appointment has been made for tomorrow 5/19

## 2019-04-07 NOTE — Telephone Encounter (Signed)
Patient can be seen in office

## 2019-04-08 ENCOUNTER — Telehealth: Payer: Self-pay | Admitting: Nurse Practitioner

## 2019-04-08 ENCOUNTER — Other Ambulatory Visit: Payer: Self-pay

## 2019-04-08 ENCOUNTER — Ambulatory Visit (INDEPENDENT_AMBULATORY_CARE_PROVIDER_SITE_OTHER): Payer: Medicare HMO | Admitting: Internal Medicine

## 2019-04-08 ENCOUNTER — Encounter: Payer: Self-pay | Admitting: Internal Medicine

## 2019-04-08 VITALS — BP 130/70 | HR 83 | Temp 98.4°F | Ht 62.25 in | Wt 121.0 lb

## 2019-04-08 DIAGNOSIS — L729 Follicular cyst of the skin and subcutaneous tissue, unspecified: Secondary | ICD-10-CM | POA: Diagnosis not present

## 2019-04-08 MED ORDER — AZITHROMYCIN 250 MG PO TABS: ORAL_TABLET | ORAL | 0 refills | Status: DC

## 2019-04-08 NOTE — Telephone Encounter (Signed)
Patient would like for surgery referral to be sent to Dr Excell Seltzer at [facility] if possible.

## 2019-04-08 NOTE — Patient Instructions (Signed)
We have sent in the azithromycin to take 2 pills today, then 1 pill a day starting tomorrow until gone.  We will get you in with a surgeon to take out the cyst for good.

## 2019-04-08 NOTE — Assessment & Plan Note (Signed)
Rx for azithromycin for infection (this is only antibiotic patient feels she can tolerate). Referral to general surgery for removal.

## 2019-04-08 NOTE — Telephone Encounter (Signed)
Patient informed of MD response and stated understanding  

## 2019-04-08 NOTE — Progress Notes (Signed)
   Subjective:   Patient ID: Stephanie Carney, female    DOB: 12/20/1931, 86 y.o.   MRN: [id number]  HPI The patient is an 85 YO female coming in for problem with lesion on her chest. Started several years ago and has flared up a couple of times. Asked a dermatologist about it once and was told it was a cyst and was not recommended removal at this time. She noticed about 1 month ago it turned red and got a small rash surrounding. She was having some swelling as well. Was not able to express anything from it. It is still swollen and sore but not as sore as it was. Pain 2/10. Denies fevers or chills. Has not used anything on it. Has not taken anything for pain.   Review of Systems  Constitutional: Negative.   HENT: Negative.   Eyes: Negative.   Respiratory: Negative for cough, chest tightness and shortness of breath.   Cardiovascular: Negative for chest pain, palpitations and leg swelling.  Gastrointestinal: Negative for abdominal distention, abdominal pain, constipation, diarrhea, nausea and vomiting.  Musculoskeletal: Negative.   Skin: Positive for rash and wound.  Neurological: Negative.   Psychiatric/Behavioral: Negative.     Objective:  Physical Exam Constitutional:      Appearance: She is well-developed.  HENT:     Head: Normocephalic and atraumatic.  Neck:     Musculoskeletal: Normal range of motion.  Cardiovascular:     Rate and Rhythm: Normal rate and regular rhythm.  Pulmonary:     Effort: Pulmonary effort is normal. No respiratory distress.     Breath sounds: Normal breath sounds. No wheezing or rales.  Abdominal:     General: Bowel sounds are normal. There is no distension.     Palpations: Abdomen is soft.     Tenderness: There is no abdominal tenderness. There is no rebound.  Skin:    General: Skin is warm and dry.     Comments: Cyst which appears infected between the breasts with slight surrounding erythema. No purulence expressible  Neurological:     Mental  Status: She is alert and oriented to person, place, and time.     Coordination: Coordination normal.     Vitals:   04/08/19 1043  BP: 130/70  Pulse: 83  Temp: 98.4 F (36.9 C)  TempSrc: Oral  SpO2: 98%  Weight: 121 lb (54.9 kg)  Height: 5' 2.25" (1.581 m)    Assessment & Plan:

## 2019-04-08 NOTE — Telephone Encounter (Signed)
It will be sent to that office yes, when they call her to schedule she can ask for that doctor.

## 2019-04-16 NOTE — Telephone Encounter (Signed)
Pt stated her cyst on chest hasn't cleared up and she want to know if you want to refill the prescription. Please advise.

## 2019-04-17 NOTE — Telephone Encounter (Signed)
I would not, I would just monitor.

## 2019-04-17 NOTE — Telephone Encounter (Signed)
Pt informed of below.  

## 2019-04-17 NOTE — Telephone Encounter (Signed)
Patient states the area is not painful. She states 1/3 of the area is still red. She wants to know if you think she needs another round of abx. Please advise.

## 2019-04-17 NOTE — Telephone Encounter (Signed)
The cyst will not go away until it is removed. Is it hurting, red?

## 2019-05-12 IMAGING — MG DIGITAL SCREENING BILATERAL MAMMOGRAM WITH TOMO AND CAD
8 series · 9 of 24 positions shown · non-contrast
Comparison: Previous exam(s).

CLINICAL DATA: Screening.

EXAM:
DIGITAL SCREENING BILATERAL MAMMOGRAM WITH TOMO AND CAD

[L CC synth-2D]
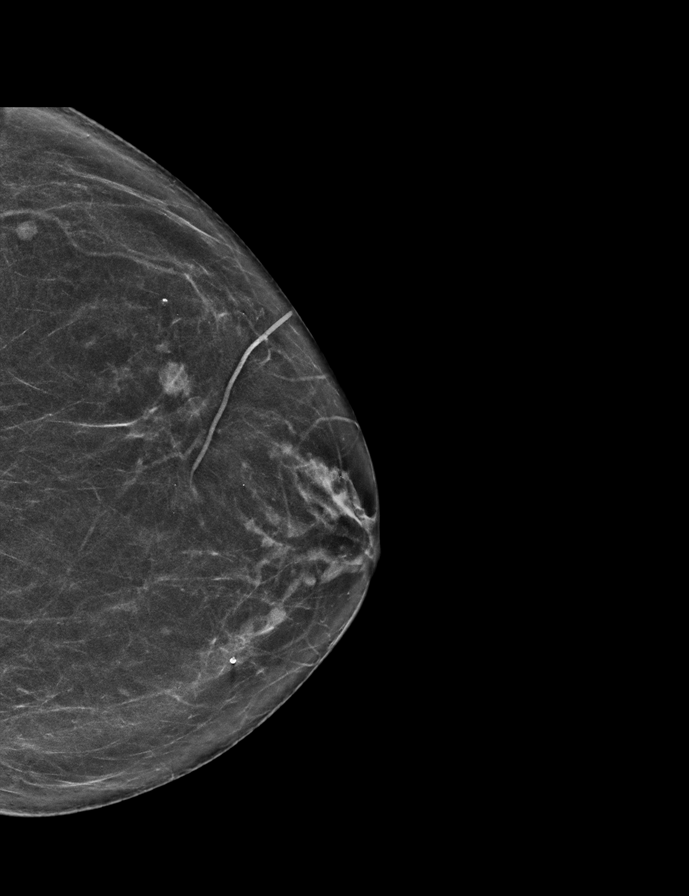

[R CC synth-2D]
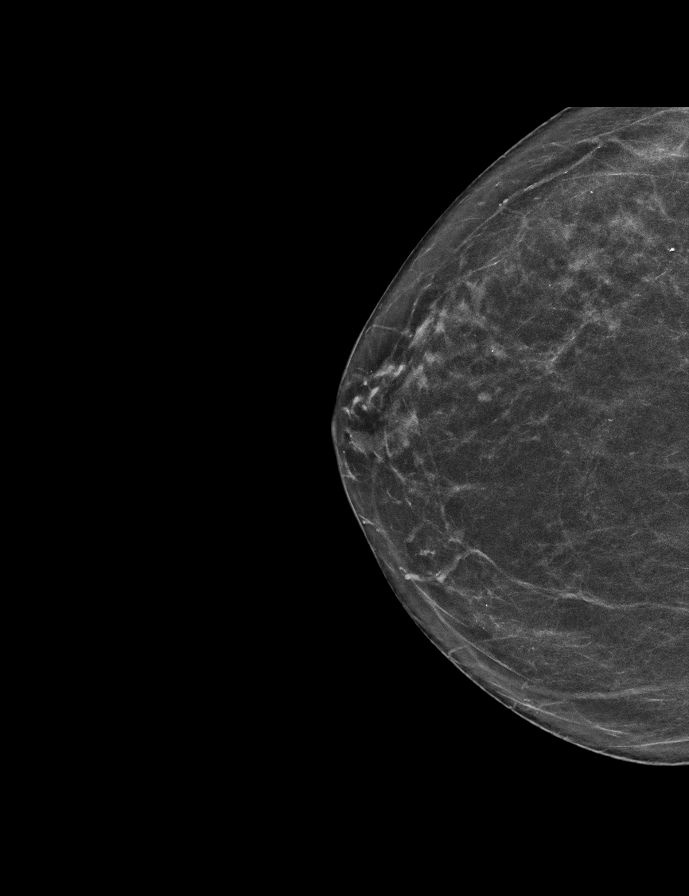

[R MLO synth-2D]
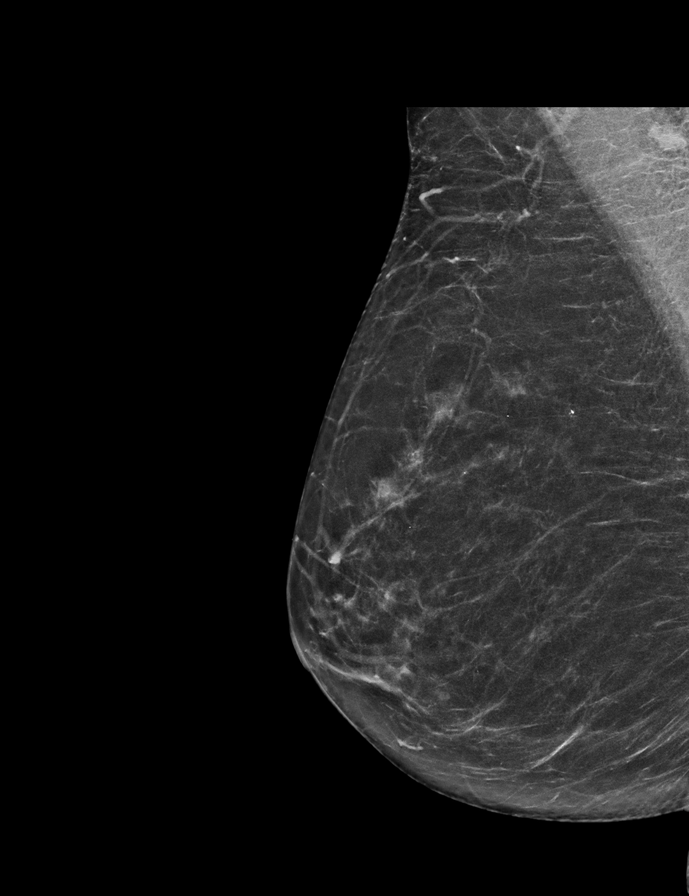

[L MLO synth-2D]
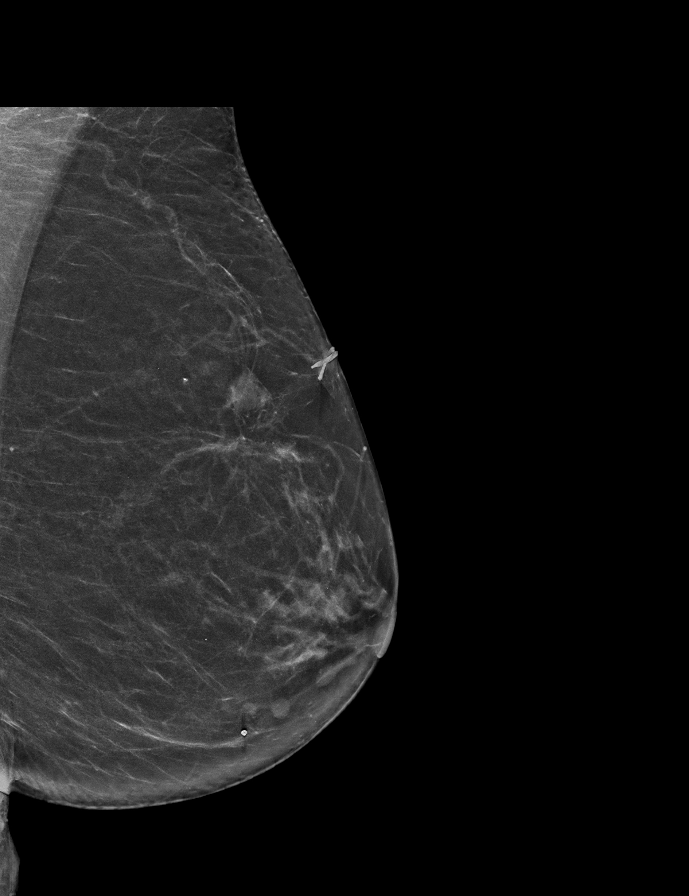

[R MLO tomo · 2 of 63 frames shown]
[frame 21/63]
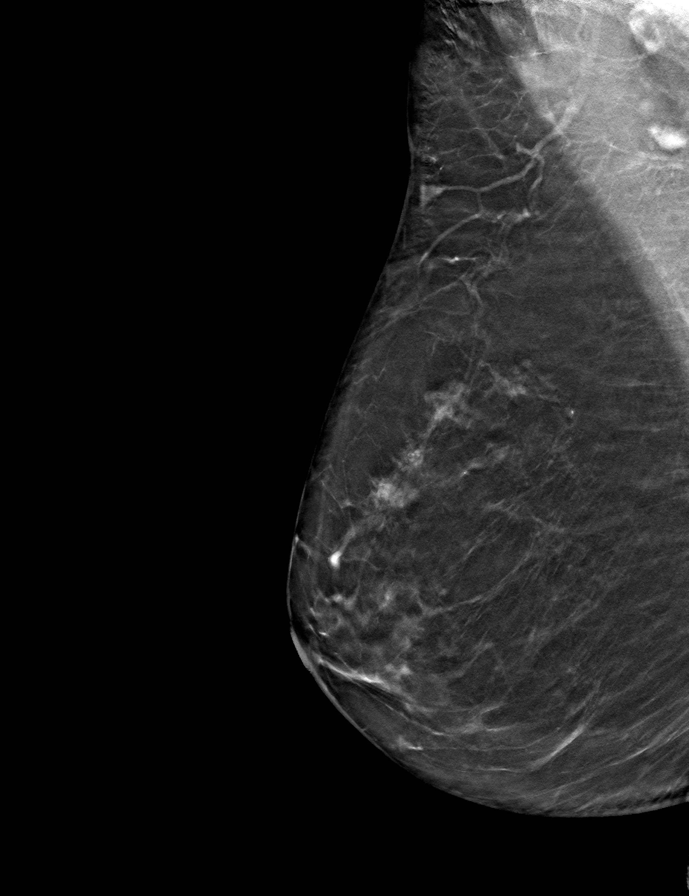
[frame 32/63]
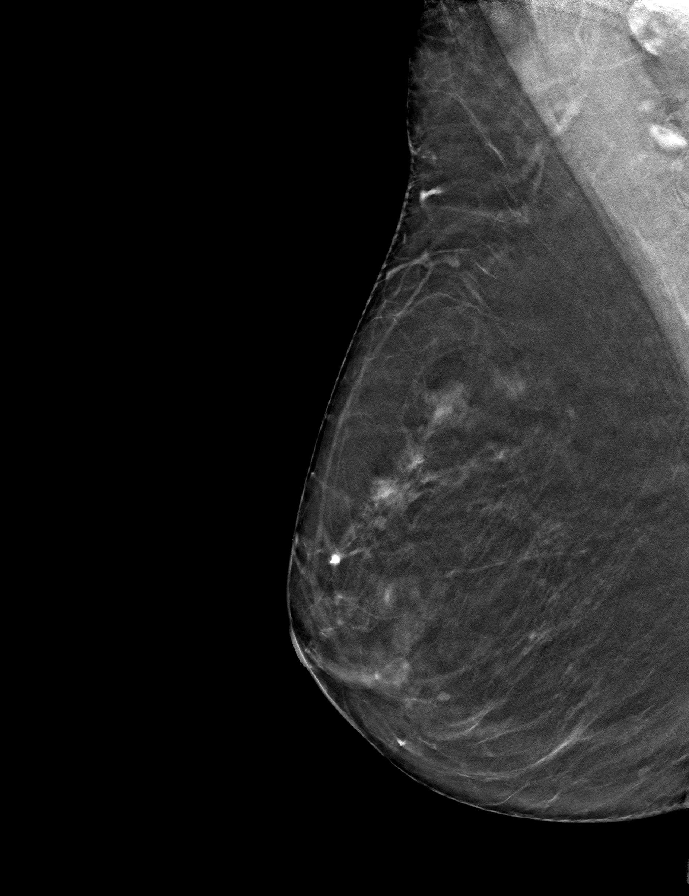

[L CC tomo · tomo slice 30/59.0]
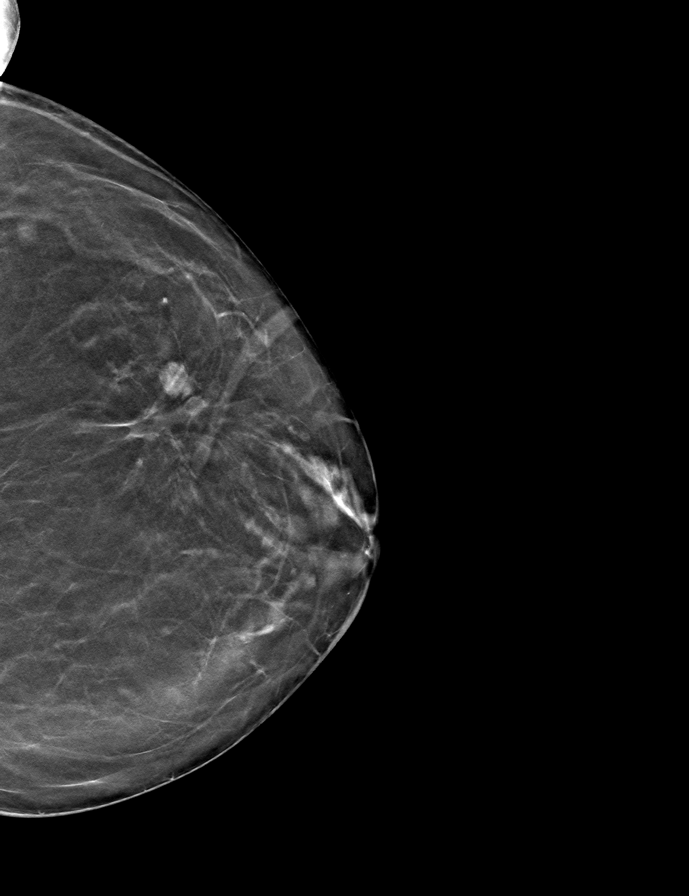

[R CC tomo · tomo slice 29/57.0]
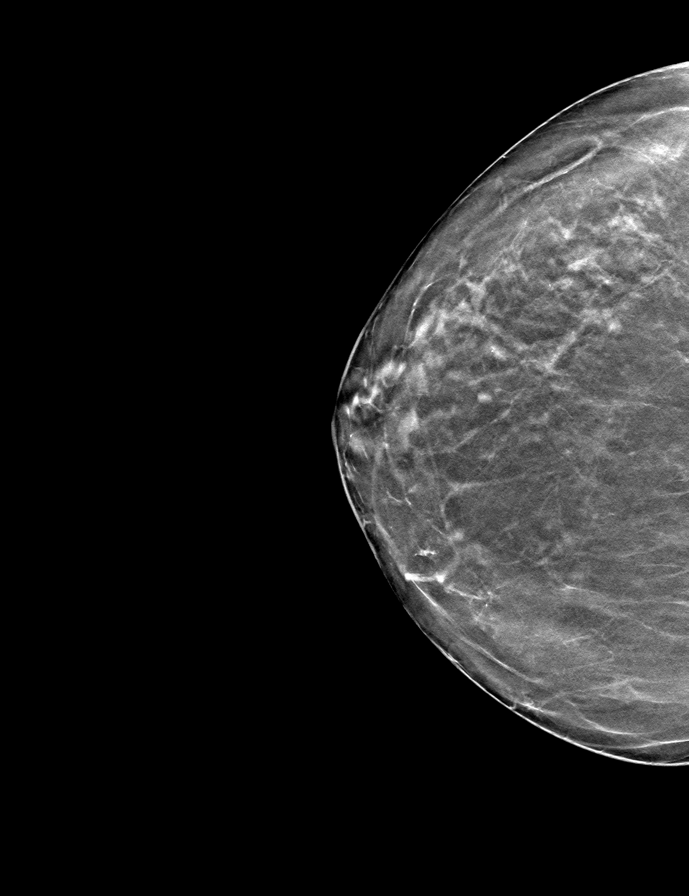

[L MLO tomo · tomo slice 31/62.0]
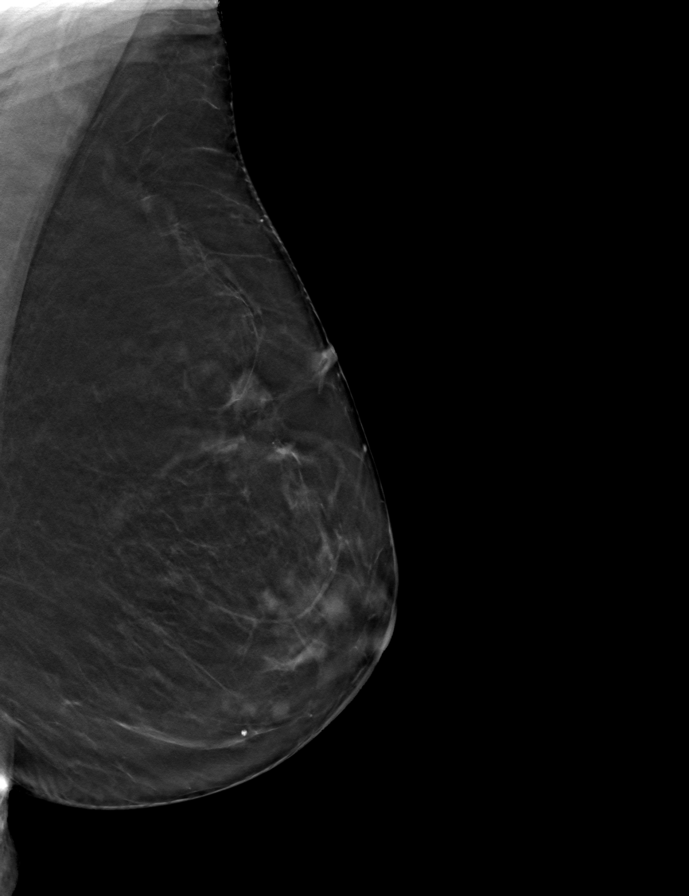

[9 of 24 positions shown; findings below may reference images not displayed]

ACR Breast Density Category b: There are scattered areas of
fibroglandular density.
FINDINGS: There are no findings suspicious for malignancy. Images were
processed with CAD.
IMPRESSION: No mammographic evidence of malignancy. A result letter of this
screening mammogram will be mailed directly to the patient.

RECOMMENDATION:
Screening mammogram in one year. (Code:[id number])

BI-RADS CATEGORY  1: Negative.

## 2019-06-17 DIAGNOSIS — H401133 Primary open-angle glaucoma, bilateral, severe stage: Secondary | ICD-10-CM | POA: Diagnosis not present

## 2019-06-17 DIAGNOSIS — Z961 Presence of intraocular lens: Secondary | ICD-10-CM | POA: Diagnosis not present

## 2019-07-15 ENCOUNTER — Other Ambulatory Visit: Payer: Self-pay | Admitting: Oncology

## 2019-07-15 DIAGNOSIS — Z1231 Encounter for screening mammogram for malignant neoplasm of breast: Secondary | ICD-10-CM

## 2019-08-28 ENCOUNTER — Ambulatory Visit
Admission: RE | Admit: 2019-08-28 | Discharge: 2019-08-28 | Disposition: A | Discharge status: Home / Self Care | Payer: Medicare HMO | Source: Ambulatory Visit | Attending: Oncology | Admitting: Oncology

## 2019-08-28 ENCOUNTER — Other Ambulatory Visit: Payer: Self-pay

## 2019-08-28 DIAGNOSIS — Z1231 Encounter for screening mammogram for malignant neoplasm of breast: Secondary | ICD-10-CM

## 2019-09-09 ENCOUNTER — Telehealth: Payer: Self-pay | Admitting: Adult Health

## 2019-09-09 NOTE — Telephone Encounter (Signed)
Returned patient's phone call regarding cancelling an appointment, per patient's request 10/21 appointment has been cancelled.

## 2019-09-11 ENCOUNTER — Encounter: Payer: Medicare HMO | Admitting: Adult Health

## 2019-09-11 DIAGNOSIS — M4727 Other spondylosis with radiculopathy, lumbosacral region: Secondary | ICD-10-CM | POA: Diagnosis not present

## 2019-09-17 ENCOUNTER — Other Ambulatory Visit: Payer: Self-pay | Admitting: Neurosurgery

## 2019-09-17 DIAGNOSIS — M4727 Other spondylosis with radiculopathy, lumbosacral region: Secondary | ICD-10-CM

## 2019-09-30 DIAGNOSIS — Z961 Presence of intraocular lens: Secondary | ICD-10-CM | POA: Diagnosis not present

## 2019-09-30 DIAGNOSIS — H401133 Primary open-angle glaucoma, bilateral, severe stage: Secondary | ICD-10-CM | POA: Diagnosis not present

## 2019-10-15 ENCOUNTER — Other Ambulatory Visit: Payer: Self-pay

## 2019-10-15 ENCOUNTER — Ambulatory Visit
Admission: RE | Admit: 2019-10-15 | Discharge: 2019-10-15 | Disposition: A | Discharge status: Home / Self Care | Payer: Medicare HMO | Source: Ambulatory Visit | Attending: Neurosurgery | Admitting: Neurosurgery

## 2019-10-15 DIAGNOSIS — M4317 Spondylolisthesis, lumbosacral region: Secondary | ICD-10-CM | POA: Diagnosis not present

## 2019-10-15 DIAGNOSIS — M48061 Spinal stenosis, lumbar region without neurogenic claudication: Secondary | ICD-10-CM | POA: Diagnosis not present

## 2019-10-15 DIAGNOSIS — M2578 Osteophyte, vertebrae: Secondary | ICD-10-CM | POA: Diagnosis not present

## 2019-10-15 DIAGNOSIS — M5136 Other intervertebral disc degeneration, lumbar region: Secondary | ICD-10-CM | POA: Diagnosis not present

## 2019-10-15 DIAGNOSIS — M4727 Other spondylosis with radiculopathy, lumbosacral region: Secondary | ICD-10-CM

## 2019-10-15 DIAGNOSIS — M4687 Other specified inflammatory spondylopathies, lumbosacral region: Secondary | ICD-10-CM | POA: Diagnosis not present

## 2019-10-15 DIAGNOSIS — M5126 Other intervertebral disc displacement, lumbar region: Secondary | ICD-10-CM | POA: Diagnosis not present

## 2019-10-15 MED ORDER — GADOBENATE DIMEGLUMINE 529 MG/ML IV SOLN
10.0000 mL | Freq: Once | INTRAVENOUS | Status: AC | PRN
  Administered 2019-10-15: 17:00:00 10 mL via INTRAVENOUS

## 2019-11-03 ENCOUNTER — Encounter: Payer: Self-pay | Admitting: Internal Medicine

## 2019-11-03 ENCOUNTER — Other Ambulatory Visit: Payer: Self-pay

## 2019-11-03 ENCOUNTER — Ambulatory Visit (INDEPENDENT_AMBULATORY_CARE_PROVIDER_SITE_OTHER): Payer: Medicare HMO | Admitting: Internal Medicine

## 2019-11-03 VITALS — BP 140/70 | HR 87 | Temp 97.8°F | Ht 62.25 in | Wt 120.0 lb

## 2019-11-03 DIAGNOSIS — L729 Follicular cyst of the skin and subcutaneous tissue, unspecified: Secondary | ICD-10-CM

## 2019-11-03 DIAGNOSIS — Z8669 Personal history of other diseases of the nervous system and sense organs: Secondary | ICD-10-CM

## 2019-11-03 DIAGNOSIS — Z7189 Other specified counseling: Secondary | ICD-10-CM

## 2019-11-03 NOTE — Progress Notes (Signed)
   Subjective:   Patient ID: Stephanie Carney, female    DOB: 11-Jan-1932, 85 y.o.   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   HENT: Negative.   Eyes: Negative.   Respiratory: Negative for cough, chest tightness and shortness of breath.   Cardiovascular: Negative for chest pain, palpitations and leg swelling.  Gastrointestinal: Negative for abdominal distention, abdominal pain, constipation, diarrhea, nausea and vomiting.  Musculoskeletal: Negative.   Skin: Negative.        cyst  Neurological: Negative.   Psychiatric/Behavioral: Negative.     Objective:  Physical Exam Constitutional:      Appearance: She is well-developed.  HENT:     Head: Normocephalic and atraumatic.  Cardiovascular:     Rate and Rhythm: Normal rate and regular rhythm.     Comments: Small cyst between the two breasts with two lumps present, no redness, swelling or tenderness, not acutely infected Pulmonary:     Effort: Pulmonary effort is normal. No respiratory distress.     Breath sounds: Normal breath  sounds. No wheezing or rales.  Abdominal:     General: Bowel sounds are normal. There is no distension.     Palpations: Abdomen is soft.     Tenderness: There is no abdominal tenderness. There is no rebound.  Musculoskeletal:     Cervical back: Normal range of motion.  Skin:    General: Skin is warm and dry.  Neurological:     Mental Status: She is alert and oriented to person, place, and time.     Coordination: Coordination normal.     Vitals:   11/03/19 1550  BP: 140/70  Pulse: 87  Temp: 97.8 F (36.6 C)  TempSrc: Oral  SpO2: 98%  Weight: 120 lb (54.4 kg)  Height: 5' 2.25" (1.581 m)    This visit occurred during the SARS-CoV-2 public health emergency.  Safety protocols were in place, including screening questions prior to the visit, additional usage of staff PPE, and extensive cleaning of exam room while observing appropriate contact time as indicated for disinfecting solutions.   Assessment & Plan:

## 2019-11-03 NOTE — Patient Instructions (Signed)
I would recommend to not take the flu shot or the covid-19 vaccine.   You do not need antibiotics today for the area on the chest.

## 2019-11-04 DIAGNOSIS — Z8669 Personal history of other diseases of the nervous system and sense organs: Secondary | ICD-10-CM | POA: Insufficient documentation

## 2019-11-04 DIAGNOSIS — Z7189 Other specified counseling: Secondary | ICD-10-CM | POA: Insufficient documentation

## 2019-11-04 NOTE — Assessment & Plan Note (Signed)
It is unclear if the episode she was talking about was clearly guillain barre but she does report this. Given current expert opinion that those with guillain barre avoid all vaccinations she would likely be advised to avoid covid-19 vaccine. Advised her that this means she would need to be constantly vigilant about wearing a mask, staying home, avoiding being closer than 6 feet to others. She understands.

## 2019-11-04 NOTE — Assessment & Plan Note (Signed)
This is not listed in her history but she reports this in 72s. Counseled that vaccines are made differently now with less additives and other ingredients and potentially could be safe. However reviewed guidelines with her that expert advice (not based on clinical trials) recommends avoidance of all flu vaccines and some other vaccines for life. We talked about the implications to [id number] vaccine as well.

## 2019-11-04 NOTE — Assessment & Plan Note (Signed)
Does not need antibiotics. Offered elective removal which she declines due to risk of covid-19.

## 2019-11-27 ENCOUNTER — Telehealth: Payer: Self-pay

## 2019-11-27 NOTE — Telephone Encounter (Signed)
Copied from [place] [id number]. Topic: Appointment Scheduling - Transfer of Care >> Nov 27, 2019 11:21 AM Richardo Priest, NT wrote: Pt is requesting to transfer FROM: [facility]leigh [facility]y/ [facility] Pt is requesting to transfer TO: [place] Reason for requested transfer: PCP left and would like to stay with Dr.Crawford as she has seen her recently   Send CRM to patient's current PCP (transferring FROM).

## 2019-12-01 NOTE — Telephone Encounter (Signed)
Fine, can schedule for TOC visit when due.

## 2019-12-01 NOTE — Telephone Encounter (Signed)
   Left message voicemail to call office to schedule appt

## 2019-12-03 DIAGNOSIS — M4727 Other spondylosis with radiculopathy, lumbosacral region: Secondary | ICD-10-CM | POA: Diagnosis not present

## 2020-01-02 ENCOUNTER — Encounter: Payer: Medicare HMO | Admitting: Family Medicine

## 2020-01-06 ENCOUNTER — Encounter: Payer: Medicare HMO | Admitting: Internal Medicine

## 2020-01-08 ENCOUNTER — Encounter: Payer: Medicare HMO | Admitting: Internal Medicine

## 2020-01-13 ENCOUNTER — Encounter: Payer: Self-pay | Admitting: Internal Medicine

## 2020-01-13 ENCOUNTER — Ambulatory Visit (INDEPENDENT_AMBULATORY_CARE_PROVIDER_SITE_OTHER): Payer: Medicare HMO | Admitting: Internal Medicine

## 2020-01-13 ENCOUNTER — Other Ambulatory Visit: Payer: Self-pay

## 2020-01-13 VITALS — BP 138/86 | HR 91 | Temp 98.1°F | Ht 62.25 in | Wt 132.0 lb

## 2020-01-13 DIAGNOSIS — R151 Fecal smearing: Secondary | ICD-10-CM | POA: Diagnosis not present

## 2020-01-13 DIAGNOSIS — K219 Gastro-esophageal reflux disease without esophagitis: Secondary | ICD-10-CM

## 2020-01-13 DIAGNOSIS — R1310 Dysphagia, unspecified: Secondary | ICD-10-CM | POA: Diagnosis not present

## 2020-01-13 NOTE — Progress Notes (Signed)
   Subjective:   Patient ID: Stephanie Carney, female    DOB: 04-05-1932, 86 y.o.   MRN: [id number]  HPI The patient is an 86 YO female coming in for transition of care as her provider has left. She does have several concerns including some fecal incontinence (when moving bowels they do not cut off normally and sometimes afterwards without her knowledge she will get some leakage, this has caused her to change social habits some, denies seeking care for this previously, going on years, denies blood in stool), and problem with liquids coming out of her nose with eating or drinking since she had a surgery, denies seeking care, has tried different head positions without relief), and GERD (after eating certain foods and night time will get some acid and nausea in stomach, denies chronic cough, denies taking anything for this).   PMH, [facility], social history reviewed and updated  Review of Systems  Constitutional: Negative.   HENT: Positive for trouble swallowing.   Eyes: Negative.   Respiratory: Negative for cough, chest tightness and shortness of breath.   Cardiovascular: Negative for chest pain, palpitations and leg swelling.  Gastrointestinal: Negative for abdominal distention, abdominal pain, constipation, diarrhea, nausea and vomiting.       Fecal incontinence, GERD  Musculoskeletal: Negative.   Skin: Negative.   Neurological: Negative.   Psychiatric/Behavioral: Negative.     Objective:  Physical Exam Constitutional:      Appearance: She is well-developed.  HENT:     Head: Normocephalic and atraumatic.  Cardiovascular:     Rate and Rhythm: Normal rate and regular rhythm.  Pulmonary:     Effort: Pulmonary effort is normal. No respiratory distress.     Breath sounds: Normal breath sounds. No wheezing or rales.  Abdominal:     General: Bowel sounds are normal. There is no distension.     Palpations: Abdomen is soft.     Tenderness: There is no abdominal tenderness. There is no rebound.   Musculoskeletal:     Cervical back: Normal range of motion.  Skin:    General: Skin is warm and dry.  Neurological:     Mental Status: She is alert and oriented to person, place, and time.     Coordination: Coordination normal.     Vitals:   01/13/20 1058  BP: 138/86  Pulse: 91  Temp: 98.1 F (36.7 C)  TempSrc: Oral  SpO2: 96%  Weight: 132 lb (59.9 kg)  Height: 5' 2.25" (1.581 m)    This visit occurred during the SARS-CoV-2 public health emergency.  Safety protocols were in place, including screening questions prior to the visit, additional usage of staff PPE, and extensive cleaning of exam room while observing appropriate contact time as indicated for disinfecting solutions.   Assessment & Plan:

## 2020-01-13 NOTE — Patient Instructions (Signed)
Try a pepcid at night time to help with the acid.   You can let us know if you want to try the muscle training for the bowels.

## 2020-01-15 DIAGNOSIS — R159 Full incontinence of feces: Secondary | ICD-10-CM | POA: Insufficient documentation

## 2020-01-15 DIAGNOSIS — K219 Gastro-esophageal reflux disease without esophagitis: Secondary | ICD-10-CM | POA: Insufficient documentation

## 2020-01-15 DIAGNOSIS — R1310 Dysphagia, unspecified: Secondary | ICD-10-CM | POA: Insufficient documentation

## 2020-01-15 NOTE — Assessment & Plan Note (Signed)
Could try SLP or ENT, she elects to wait for now given covid-19 pandemic.

## 2020-01-15 NOTE — Assessment & Plan Note (Signed)
Advised that pelvic floor training may improve this or stop completely. Given covid-19 pandemic she elects to wait for now.

## 2020-01-15 NOTE — Assessment & Plan Note (Signed)
Advised she can try pepcid at night time.

## 2020-01-27 DIAGNOSIS — H401133 Primary open-angle glaucoma, bilateral, severe stage: Secondary | ICD-10-CM | POA: Diagnosis not present

## 2020-01-27 DIAGNOSIS — Z961 Presence of intraocular lens: Secondary | ICD-10-CM | POA: Diagnosis not present

## 2020-03-26 ENCOUNTER — Emergency Department (HOSPITAL_COMMUNITY): Payer: Medicare HMO

## 2020-03-26 ENCOUNTER — Emergency Department (HOSPITAL_COMMUNITY)
Admission: EM | Admit: 2020-03-26 | Discharge: 2020-03-26 | Disposition: A | Discharge status: Home / Self Care | Payer: Medicare HMO | Attending: Emergency Medicine | Admitting: Emergency Medicine

## 2020-03-26 ENCOUNTER — Other Ambulatory Visit: Payer: Self-pay

## 2020-03-26 ENCOUNTER — Encounter (HOSPITAL_COMMUNITY): Payer: Self-pay | Admitting: Emergency Medicine

## 2020-03-26 DIAGNOSIS — S0181XA Laceration without foreign body of other part of head, initial encounter: Secondary | ICD-10-CM | POA: Diagnosis not present

## 2020-03-26 DIAGNOSIS — Z7982 Long term (current) use of aspirin: Secondary | ICD-10-CM | POA: Diagnosis not present

## 2020-03-26 DIAGNOSIS — W01198A Fall on same level from slipping, tripping and stumbling with subsequent striking against other object, initial encounter: Secondary | ICD-10-CM | POA: Diagnosis not present

## 2020-03-26 DIAGNOSIS — S0990XA Unspecified injury of head, initial encounter: Secondary | ICD-10-CM

## 2020-03-26 DIAGNOSIS — E119 Type 2 diabetes mellitus without complications: Secondary | ICD-10-CM | POA: Diagnosis not present

## 2020-03-26 DIAGNOSIS — Y93A9 Activity, other involving cardiorespiratory exercise: Secondary | ICD-10-CM | POA: Insufficient documentation

## 2020-03-26 DIAGNOSIS — S069X9A Unspecified intracranial injury with loss of consciousness of unspecified duration, initial encounter: Secondary | ICD-10-CM | POA: Diagnosis not present

## 2020-03-26 DIAGNOSIS — S0121XA Laceration without foreign body of nose, initial encounter: Secondary | ICD-10-CM | POA: Diagnosis not present

## 2020-03-26 DIAGNOSIS — S0083XA Contusion of other part of head, initial encounter: Secondary | ICD-10-CM | POA: Diagnosis not present

## 2020-03-26 DIAGNOSIS — Y929 Unspecified place or not applicable: Secondary | ICD-10-CM | POA: Diagnosis not present

## 2020-03-26 DIAGNOSIS — J45909 Unspecified asthma, uncomplicated: Secondary | ICD-10-CM | POA: Insufficient documentation

## 2020-03-26 DIAGNOSIS — Y999 Unspecified external cause status: Secondary | ICD-10-CM | POA: Insufficient documentation

## 2020-03-26 DIAGNOSIS — Z91040 Latex allergy status: Secondary | ICD-10-CM | POA: Insufficient documentation

## 2020-03-26 DIAGNOSIS — W19XXXA Unspecified fall, initial encounter: Secondary | ICD-10-CM

## 2020-03-26 NOTE — ED Provider Notes (Signed)
  Face-to-face evaluation   History: She states she fell on uneven area in the sidewalk when she was walking in the park.  She was able to get up with help and has been ambulate easily since then.  She complains of pain only in her face.  Physical exam: Awake, alert and cooperative.  She has normal gait.  She has mild swelling of the right side of her nose and right cheek.  Small laceration right nasal bridge.  No tenderness or contusion or laceration of the scalp.  Medical screening examination/treatment/procedure(s) were conducted as a shared visit with non-physician practitioner(s) and myself.  I personally evaluated the patient during the encounter    Daleen Bo, MD 03/29/20 1719

## 2020-03-26 NOTE — Discharge Instructions (Addendum)
You have a small laceration on the bridge of your nose that was repaired with Dermabond glue, this will fall off on its own in about a week, you can shower but do not scrub over this area.  If you notice surrounding redness, swelling or drainage from the area these are signs of infection and you should return for reevaluation.  Your scans today did not show any evidence of fractures or bleeding in your brain.  If you develop severe headache, vomiting, have a hard time staying awake or have vision changes or other new or concerning symptoms return to the ED.

## 2020-03-26 NOTE — ED Triage Notes (Signed)
Patient arrives s/p fall. Patient states she caught the toe of her shoe on uneven concrete and tripped. Patient hit her head on the ground when she fell and endorses a short period of LOC. Patient noted to have small laceration to face, but no other injuries. Patient endorses generalized soreness.

## 2020-03-26 NOTE — ED Provider Notes (Signed)
[place] DEPT Provider Note   CSN: [id number] Arrival date & time: 03/26/20  1844     History Chief Complaint  Patient presents with  . Fall  . Head Injury    Stephanie Carney is a 86 y.o. female.  Stephanie Carney is a 86 y.o. female with a history of diabetes, breast cancer, arthritis, fibromyalgia, osteoporosis, who presents to the emergency department for evaluation after mechanical fall.  She states she was outside getting some exercise when she got tripped up on her feet and fell forward striking her head and face on the ground.  She did not have any loss of consciousness.  Reports a mild frontal headache.  No vision changes, no nausea or vomiting no dizziness, no numbness weakness or tingling.  She reports that she struck her nose and also has some swelling and bruising around her eyes.  Had some slight bleeding from the nose that has since resolved.  States that she went down on her knees, and aside from a slight abrasion there she reports her knees feel fine, she has been able to walk since the accident and drove herself to the hospital today.  Denies any pain in her other extremities.  Denies any neck or back pain.  No chest or abdominal pain.  Patient reports an allergy to "insect pain, but all other vaccinations are up-to-date normal        Past Medical History:  Diagnosis Date  . Acid reflux 02/10/2011  . Arthritis   . Asthma   . Breast cancer ([place]) 02/10/2011   left breast  . Bruises easily 02/10/2011  . Cancer ([place])   . Cervical cancer ([place]) 02/10/2011  . Diabetes mellitus   . Fibromyalgia 02/10/2011  . Glaucoma   . Goiter 2014  . Joint pain 02/10/2011  . Night sweats 02/10/2011  . Osteoporosis 04/10/2018  . Shingles   . Weight increase 02/10/2011    Patient Active Problem List   Diagnosis Date Noted  . Fecal incontinence 01/15/2020  . GERD (gastroesophageal reflux disease) 01/15/2020  . Swallowing problem 01/15/2020  .  Counseled about COVID-19 virus infection 11/04/2019  . Hx of Guillain-Barre syndrome 11/04/2019  . Cyst of skin 04/08/2019  . Osteoporosis 04/10/2018  . Encounter for general adult medical examination with abnormal findings 02/06/2018  . Urinary incontinence 02/06/2018  . Heart murmur 02/06/2018  . Osteopenia 02/06/2018  . Malignant neoplasm of areola of left breast in female, estrogen receptor positive ([place]) 07/14/2015  . Thyroid nodule 06/19/2013  . Pain in joint, lower leg 06/19/2013    Past Surgical History:  Procedure Laterality Date  . BREAST LUMPECTOMY  02/2011   left  . CERVICAL CONE BIOPSY    . LEG SURGERY  may 2011  . SPINE SURGERY    . UTERINE SUSPENSION     Growth/mass removal     OB History   No obstetric history on file.     Family History  Problem Relation Age of Onset  . Cancer Father        enviromental exposure  . Cancer Brother        enviromental exposure    Social History   Tobacco Use  . Smoking status: Never Smoker  . Smokeless tobacco: Never Used  Substance Use Topics  . Alcohol use: No  . Drug use: No    Home Medications Prior to Admission medications   Medication Sig Start Date End Date Taking? Authorizing Provider  aspirin 81 MG  tablet Take 81 mg by mouth daily.      [provider]  AZOPT 1 % ophthalmic suspension BID times 48H. 10/11/11   [provider]  betaxolol (BETOPTIC-S) 0.25 % ophthalmic suspension 1 drop 2 (two) times daily.      [provider]  COD LIVER OIL PO Take by mouth.    [provider]  MULTIPLE VITAMIN PO Take 1 tablet by mouth daily.    [provider]  Multiple Vitamins-Minerals (EYE VITAMINS) CAPS Take by mouth.    [provider]  Vitamin Mixture (ESTER-C PO) Take by mouth.    [provider]  XALATAN 0.005 % ophthalmic solution daily. 11/11/11   [provider]    Allergies    Latanoprost, Bactrim, Doxycycline, Tetracyclines &  related, Ciprofloxacin, Macrobid [nitrofurantoin macrocrystal], and Latex  Review of Systems   Review of Systems  Constitutional: Negative for chills and fever.  HENT: Positive for nosebleeds.   Eyes: Negative for visual disturbance.  Respiratory: Negative for cough and shortness of breath.   Cardiovascular: Negative for chest pain.  Gastrointestinal: Negative for abdominal pain.  Genitourinary: Negative for dysuria and frequency.  Musculoskeletal: Negative for arthralgias, back pain, joint swelling and myalgias.  Skin: Positive for wound.  Neurological: Positive for headaches. Negative for dizziness, syncope, weakness, light-headedness and numbness.    Physical Exam Updated Vital Signs BP (!) 144/56   Pulse 76   Temp 98.2 F (36.8 C) (Oral)   Resp (!) 21   SpO2 99%   Physical Exam Vitals and nursing note reviewed.  Constitutional:      General: She is not in acute distress.    Appearance: Normal appearance. She is well-developed and normal weight. She is not ill-appearing or diaphoretic.     Comments: Well-appearing and in no distress  HENT:     Head: Normocephalic.     Comments: Hematoma noted to the forehead and there is also bruising over the bridge of the nose and primarily around the right eye, mild tenderness but no step-off or deformity noted.  No battle sign, no hemotympanum or CSF otorrhea    Nose:     Comments: Bruising, swelling and tenderness noted over the bridge of the nose and mild with 0.75 cm laceration present on the right side of the nose, this is very superficial with no bleeding, and is already well approximated, no epistaxis bilaterally, no septal hematoma.  Nares patent bilaterally.    Mouth/Throat:     Mouth: Mucous membranes are moist.     Pharynx: Oropharynx is clear.     Comments: No tenderness over the jaw, no malocclusion, no pain over the teeth or evidence of broken teeth Eyes:     General:        Right eye: No discharge.        Left eye: No  discharge.     Extraocular Movements: Extraocular movements intact.     Pupils: Pupils are equal, round, and reactive to light.  Neck:     Comments: C-spine nontender to palpation at midline or paraspinally, normal range of motion in all directions.  No seatbelt sign, no palpable deformity or crepitus Cardiovascular:     Rate and Rhythm: Normal rate and regular rhythm.     Heart sounds: Normal heart sounds.  Pulmonary:     Effort: Pulmonary effort is normal. No respiratory distress.     Breath sounds: Normal breath sounds. No wheezing or rales.  Chest:  Chest wall: No tenderness.  Abdominal:     General: Bowel sounds are normal. There is no distension.     Palpations: Abdomen is soft. There is no mass.     Tenderness: There is no abdominal tenderness. There is no guarding.  Musculoskeletal:        General: No deformity.     Cervical back: Neck supple.     Comments: T-spine and L-spine nontender to palpation at midline. Superficial abrasions noted to bilateral knees, but with full range of motion without pain.  Patient moves all extremities without difficulty. All joints supple and easily movable, no erythema, swelling or palpable deformity, all compartments soft.   Skin:    General: Skin is warm and dry.     Capillary Refill: Capillary refill takes less than 2 seconds.  Neurological:     Mental Status: She is alert.     Coordination: Coordination normal.     Comments: Speech is clear, able to follow commands CN III-XII intact Normal strength in upper and lower extremities bilaterally including dorsiflexion and plantar flexion, strong and equal grip strength Sensation normal to light and sharp touch Moves extremities without ataxia, coordination intact  Psychiatric:        Mood and Affect: Mood normal.        Behavior: Behavior normal.     ED Results / Procedures / Treatments   Labs (all labs ordered are listed, but only abnormal results are displayed) Labs Reviewed - No  data to display  EKG None  Radiology CT Head Wo Contrast  Result Date: 03/26/2020 CLINICAL DATA:  Tripped and fell, loss of consciousness, facial laceration EXAM: CT HEAD WITHOUT CONTRAST TECHNIQUE: Contiguous axial images were obtained from the base of the skull through the vertex without intravenous contrast. COMPARISON:  07/19/2010 FINDINGS: Brain: No acute infarct or hemorrhage. Lateral ventricles and midline structures are unremarkable. No acute extra-axial fluid collections. No mass effect. Vascular: No hyperdense vessel or unexpected calcification. Skull: Normal. Negative for fracture or focal lesion. Sinuses/Orbits: No acute finding. Other: None. IMPRESSION: 1. No acute intracranial process. Electronically Signed   By: Randa Ngo M.D.   On: 03/26/2020 22:14   CT Maxillofacial Wo Contrast  Result Date: 03/26/2020 CLINICAL DATA:  Tripped and fell, facial laceration EXAM: CT MAXILLOFACIAL WITHOUT CONTRAST TECHNIQUE: Multidetector CT imaging of the maxillofacial structures was performed. Multiplanar CT image reconstructions were also generated. COMPARISON:  None. FINDINGS: Osseous: No fracture or mandibular dislocation. No destructive process. Orbits: Negative. No traumatic or inflammatory finding. Sinuses: Clear. Soft tissues: Negative. Limited intracranial: No significant or unexpected finding. IMPRESSION: 1. No acute facial bone fracture. Electronically Signed   By: Randa Ngo M.D.   On: 03/26/2020 22:19    Procedures .Marland KitchenLaceration Repair  Date/Time: 03/29/2020 1:58 PM Performed by: Jacqlyn Larsen, PA-C Authorized by: Jacqlyn Larsen, PA-C   Consent:    Consent obtained:  Verbal   Consent given by:  Patient   Risks discussed:  Infection, pain, poor cosmetic result and poor wound healing   Alternatives discussed:  No treatment Anesthesia (see MAR for exact dosages):    Anesthesia method:  None Laceration details:    Location:  Face   Face location:  Nose   Length (cm):  0.8    Depth (mm):  1 Repair type:    Repair type:  Simple Exploration:    Hemostasis achieved with:  Direct pressure   Wound exploration: entire depth of wound probed and visualized  Wound extent: areolar tissue violated     Wound extent: no underlying fracture noted   Treatment:    Area cleansed with:  Shur-Clens   Amount of cleaning:  Standard Skin repair:    Repair method:  Tissue adhesive Approximation:    Approximation:  Close Post-procedure details:    Dressing:  Open (no dressing)   Patient tolerance of procedure:  Tolerated well, no immediate complications   (including critical care time)  Medications Ordered in ED Medications - No data to display  ED Course  I have reviewed the triage vital signs and the nursing notes.  Pertinent labs & imaging results that were available during my care of the patient were reviewed by me and considered in my medical decision making (see chart for details).    MDM Rules/Calculators/A&P                      87 year old female presents after mechanical fall with injury to the head and face.  She has some superficial abrasions to the knees but is able to ambulate without difficulty and has no swelling or deformity.  Patient did not have loss of consciousness and does not have any focal neurologic deficits but given patient's age, will get CT of the head.  She also has bruising around the face and right eye, will also get CT maxillofacial.  No midline spinal tenderness or tenderness over the chest or abdomen.  Patient does not want x-rays of her knees, states just a small scrape and she is able to walk without difficulty.  X-rays of the head and maxillofacial bones reviewed by myself, no evidence of fracture or intracranial bleeding.  Small laceration on the nose repaired with Dermabond.  At this time patient is stable for discharge home.  Return precautions were discussed.  Patient expresses understanding and agreement.  Discharged home in good  condition.  Final Clinical Impression(s) / ED Diagnoses Final diagnoses:  Fall, initial encounter  Injury of head, initial encounter  Contusion of face, initial encounter  Laceration of nose, initial encounter    Rx / DC Orders ED Discharge Orders    None       Janet Berlin 03/29/20 1417    Daleen Bo, MD 03/29/20 1719

## 2020-03-30 ENCOUNTER — Ambulatory Visit (INDEPENDENT_AMBULATORY_CARE_PROVIDER_SITE_OTHER): Payer: Medicare HMO | Admitting: Internal Medicine

## 2020-03-30 ENCOUNTER — Encounter: Payer: Self-pay | Admitting: Internal Medicine

## 2020-03-30 ENCOUNTER — Other Ambulatory Visit: Payer: Self-pay

## 2020-03-30 DIAGNOSIS — R519 Headache, unspecified: Secondary | ICD-10-CM | POA: Diagnosis not present

## 2020-03-30 DIAGNOSIS — M25511 Pain in right shoulder: Secondary | ICD-10-CM

## 2020-03-30 NOTE — Patient Instructions (Signed)
You can take tylenol in the evening to help prevent waking up in pain.

## 2020-03-30 NOTE — Progress Notes (Signed)
   Subjective:   Patient ID: Stephanie Carney, female    DOB: Dec 04, 1931, 86 y.o.   MRN: [id number]  HPI The patient is an 86 YO female coming in for several new problems including right arm pain. Did have fall on 03/26/20 with injury to the nose bridge which was closed with skin glue. She noticed the next day and later same day pain in the right shoulder. The day after fall she had severe limitation of ROM and pain. She did rest the arm and next day mild improvement. She is now feeling mildly better with some improvement in ROM. She is not taking anything for pain as she is very sensitive to medications. The pain is waking her up at night. Arm still feels weak. Denies numbness or tingling in the right arm. Has chronic neck pain problems which are stable from prior.   Review of Systems  Constitutional: Negative.   HENT: Negative.   Eyes: Negative.   Respiratory: Negative for cough, chest tightness and shortness of breath.   Cardiovascular: Negative for chest pain, palpitations and leg swelling.  Gastrointestinal: Negative for abdominal distention, abdominal pain, constipation, diarrhea, nausea and vomiting.  Musculoskeletal: Positive for arthralgias, myalgias and neck pain.  Skin: Positive for color change and wound.  Neurological: Negative.   Psychiatric/Behavioral: Negative.     Objective:  Physical Exam Constitutional:      Appearance: She is well-developed.  HENT:     Head: Normocephalic and atraumatic.  Cardiovascular:     Rate and Rhythm: Normal rate and regular rhythm.  Pulmonary:     Effort: Pulmonary effort is normal. No respiratory distress.     Breath sounds: Normal breath sounds. No wheezing or rales.  Abdominal:     General: Bowel sounds are normal. There is no distension.     Palpations: Abdomen is soft.     Tenderness: There is no abdominal tenderness. There is no rebound.  Musculoskeletal:        General: Tenderness present.     Cervical back: Normal range of motion.      Comments: Right arm with pain in the biceps region and in the [facility] joint region. No bone deformity or concerns for shoulder dislocation.   Skin:    General: Skin is warm and dry.     Comments: Some bruising around right eye more than left, wound on the bridge of the nose without oozing or bleeding. No tenderness to palpation of the maxillary bones.   Neurological:     Mental Status: She is alert and oriented to person, place, and time.     Coordination: Coordination normal.     Vitals:   03/30/20 1402  BP: (!) 144/72  Pulse: 80  Temp: 98 F (36.7 C)  SpO2: 100%  Weight: 121 lb (54.9 kg)  Height: 5' 2.25" (1.581 m)    This visit occurred during the SARS-CoV-2 public health emergency.  Safety protocols were in place, including screening questions prior to the visit, additional usage of staff PPE, and extensive cleaning of exam room while observing appropriate contact time as indicated for disinfecting solutions.   Assessment & Plan:

## 2020-03-31 ENCOUNTER — Telehealth: Payer: Self-pay | Admitting: Internal Medicine

## 2020-03-31 DIAGNOSIS — R519 Headache, unspecified: Secondary | ICD-10-CM | POA: Insufficient documentation

## 2020-03-31 DIAGNOSIS — M25511 Pain in right shoulder: Secondary | ICD-10-CM | POA: Insufficient documentation

## 2020-03-31 NOTE — Telephone Encounter (Signed)
    Patient calling to report after she had Covid vaccines she experienced shortness of breath. No longer having symptoms, but wanted to make  Dr Sharlet Salina aware

## 2020-03-31 NOTE — Assessment & Plan Note (Signed)
Advised to take tylenol in the evening to prevent waking up in pain. Offered muscle relaxers but she declines due to being very sensitive to pain medications in general.

## 2020-03-31 NOTE — Assessment & Plan Note (Signed)
Due to fall, appears to be healing appropriately. Reviewed CT findings with patient of no facial bone fracture and no new deformities on exam.

## 2020-04-01 NOTE — Telephone Encounter (Signed)
Noted  

## 2020-04-01 NOTE — Telephone Encounter (Signed)
Please advise 

## 2020-05-26 DIAGNOSIS — M67911 Unspecified disorder of synovium and tendon, right shoulder: Secondary | ICD-10-CM | POA: Diagnosis not present

## 2020-06-04 DIAGNOSIS — H401133 Primary open-angle glaucoma, bilateral, severe stage: Secondary | ICD-10-CM | POA: Diagnosis not present

## 2020-06-29 DIAGNOSIS — M25562 Pain in left knee: Secondary | ICD-10-CM | POA: Diagnosis not present

## 2020-06-29 DIAGNOSIS — M25552 Pain in left hip: Secondary | ICD-10-CM | POA: Diagnosis not present

## 2020-07-09 DIAGNOSIS — R262 Difficulty in walking, not elsewhere classified: Secondary | ICD-10-CM | POA: Diagnosis not present

## 2020-07-09 DIAGNOSIS — M6281 Muscle weakness (generalized): Secondary | ICD-10-CM | POA: Diagnosis not present

## 2020-07-15 DIAGNOSIS — M6281 Muscle weakness (generalized): Secondary | ICD-10-CM | POA: Diagnosis not present

## 2020-07-15 DIAGNOSIS — R262 Difficulty in walking, not elsewhere classified: Secondary | ICD-10-CM | POA: Diagnosis not present

## 2020-07-22 DIAGNOSIS — R262 Difficulty in walking, not elsewhere classified: Secondary | ICD-10-CM | POA: Diagnosis not present

## 2020-07-22 DIAGNOSIS — M6281 Muscle weakness (generalized): Secondary | ICD-10-CM | POA: Diagnosis not present

## 2020-07-29 DIAGNOSIS — M6281 Muscle weakness (generalized): Secondary | ICD-10-CM | POA: Diagnosis not present

## 2020-07-29 DIAGNOSIS — R262 Difficulty in walking, not elsewhere classified: Secondary | ICD-10-CM | POA: Diagnosis not present

## 2020-10-07 ENCOUNTER — Other Ambulatory Visit: Payer: Self-pay | Admitting: Internal Medicine

## 2020-10-07 DIAGNOSIS — Z1231 Encounter for screening mammogram for malignant neoplasm of breast: Secondary | ICD-10-CM

## 2020-10-25 DIAGNOSIS — H401133 Primary open-angle glaucoma, bilateral, severe stage: Secondary | ICD-10-CM | POA: Diagnosis not present

## 2020-10-25 DIAGNOSIS — Z961 Presence of intraocular lens: Secondary | ICD-10-CM | POA: Diagnosis not present

## 2020-11-24 ENCOUNTER — Ambulatory Visit: Payer: Medicare HMO

## 2021-04-11 DIAGNOSIS — H401133 Primary open-angle glaucoma, bilateral, severe stage: Secondary | ICD-10-CM | POA: Diagnosis not present

## 2021-04-11 DIAGNOSIS — H548 Legal blindness, as defined in USA: Secondary | ICD-10-CM | POA: Diagnosis not present

## 2021-04-11 DIAGNOSIS — Z961 Presence of intraocular lens: Secondary | ICD-10-CM | POA: Diagnosis not present

## 2021-05-24 ENCOUNTER — Telehealth: Payer: Self-pay | Admitting: Internal Medicine

## 2021-05-24 NOTE — Telephone Encounter (Signed)
LVM for pt to rtn my call to schedule AWV with [facility]. Please schedule AWV if pt calls the office  

## 2021-07-12 ENCOUNTER — Other Ambulatory Visit: Payer: Self-pay

## 2021-07-12 ENCOUNTER — Ambulatory Visit (INDEPENDENT_AMBULATORY_CARE_PROVIDER_SITE_OTHER): Payer: Medicare HMO | Admitting: Otolaryngology

## 2021-07-12 DIAGNOSIS — J310 Chronic rhinitis: Secondary | ICD-10-CM

## 2021-07-12 NOTE — Progress Notes (Signed)
HPI: Stephanie Carney is a 87 y.o. female who presents for evaluation of ear complaints from a couple weeks ago where she was having decreased hearing and some gurgling in her ears.  This started with nasal sinus congestion.  She felt like she might of had a viral infection as she had a little bit of a low-grade fever.  She has been doing much better this past week with improved hearing.  She presents today to have her ears and nose checked.  Past Medical History:  Diagnosis Date   Acid reflux 02/10/2011   Arthritis    Asthma    Breast cancer ([place]) 02/10/2011   left breast   Bruises easily 02/10/2011   Cancer ([place])    Cervical cancer ([place]) 02/10/2011   Diabetes mellitus    Fibromyalgia 02/10/2011   Glaucoma    Goiter 2014   Joint pain 02/10/2011   Night sweats 02/10/2011   Osteoporosis 04/10/2018   Shingles    Weight increase 02/10/2011   Past Surgical History:  Procedure Laterality Date   BREAST LUMPECTOMY  02/2011   left   CERVICAL CONE BIOPSY     LEG SURGERY  may 2011   SPINE SURGERY     UTERINE SUSPENSION     Growth/mass removal   Social History   Socioeconomic History   Marital status: Divorced    Spouse name: Not on file   Number of children: Not on file   Years of education: Not on file   Highest education level: Not on file  Occupational History   Not on file  Tobacco Use   Smoking status: Never   Smokeless tobacco: Never  Substance and Sexual Activity   Alcohol use: No   Drug use: No   Sexual activity: Yes    Birth control/protection: Post-menopausal  Other Topics Concern   Not on file  Social History Narrative   Not on file   Social Determinants of Health   Financial Resource Strain: Not on file  Food Insecurity: Not on file  Transportation Needs: Not on file  Physical Activity: Not on file  Stress: Not on file  Social Connections: Not on file   Family History  Problem Relation Age of Onset   Cancer Father        enviromental exposure   Cancer  Brother        enviromental exposure   Allergies  Allergen Reactions   Latanoprost Other (See Comments)    Asthma, generic gives her asthma, brand name does not   Bactrim Nausea And Vomiting   Doxycycline Nausea And Vomiting    Glaucoma damage And headache   Tetracyclines & Related Nausea And Vomiting    And headache   Ciprofloxacin     Muscle pain   Macrobid [Nitrofurantoin Macrocrystal]     Muscle pain   Latex Rash    welts   Prior to Admission medications   Medication Sig Start Date End Date Taking? Authorizing Provider  aspirin 81 MG tablet Take 81 mg by mouth daily.      [provider]  AZOPT 1 % ophthalmic suspension BID times 48H. 10/11/11   [provider]  betaxolol (BETOPTIC-S) 0.25 % ophthalmic suspension 1 drop 2 (two) times daily.      [provider]  COD LIVER OIL PO Take by mouth.    [provider]  MULTIPLE VITAMIN PO Take 1 tablet by mouth daily.    [provider]  Multiple Vitamins-Minerals (EYE VITAMINS)  CAPS Take by mouth.    [provider]  Vitamin Mixture (ESTER-C PO) Take by mouth.    [provider]  XALATAN 0.005 % ophthalmic solution daily. 11/11/11   [provider]     Positive ROS: Otherwise negative  All other systems have been reviewed and were otherwise negative with the exception of those mentioned in the HPI and as above.  Physical Exam: Constitutional: Alert, well-appearing, no acute distress Ears: External ears without lesions or tenderness. Ear canals are clear bilaterally.  Both TMs are clear with no middle ear effusion noted although she does have a slight retraction of the left TM on the incus long process.  On tuning fork testing AC > BC bilaterally and she heard about the same in both ears and is not noticing any hearing problems today. Nasal: External nose without lesions. Septum with minimal deformity and mild rhinitis both middle meatus regions are clear  with no mucopurulent discharge.. Clear nasal passages otherwise. Oral: Lips and gums without lesions. Tongue and palate mucosa without lesions. Posterior oropharynx clear. Neck: No palpable adenopathy or masses Respiratory: Breathing comfortably  Skin: No facial/neck lesions or rash noted.  Procedures  Assessment: Clear exam today with no signs of infection or middle ear effusion.  Plan: Reassured her of normal exam today with no signs of active infection. Did not recommend any new medication She will follow-up as needed.  Radene Journey, MD

## 2021-08-02 DIAGNOSIS — N390 Urinary tract infection, site not specified: Secondary | ICD-10-CM | POA: Diagnosis not present

## 2021-09-05 DIAGNOSIS — H401133 Primary open-angle glaucoma, bilateral, severe stage: Secondary | ICD-10-CM | POA: Diagnosis not present

## 2021-09-05 DIAGNOSIS — H548 Legal blindness, as defined in USA: Secondary | ICD-10-CM | POA: Diagnosis not present

## 2021-12-13 DIAGNOSIS — N3000 Acute cystitis without hematuria: Secondary | ICD-10-CM | POA: Diagnosis not present

## 2022-02-02 ENCOUNTER — Ambulatory Visit: Payer: Medicare HMO | Admitting: Cardiovascular Disease

## 2022-02-21 ENCOUNTER — Institutional Professional Consult (permissible substitution): Payer: Medicare HMO | Admitting: Pulmonary Disease

## 2022-02-22 DIAGNOSIS — R350 Frequency of micturition: Secondary | ICD-10-CM | POA: Diagnosis not present

## 2022-02-22 DIAGNOSIS — R300 Dysuria: Secondary | ICD-10-CM | POA: Diagnosis not present

## 2022-02-22 DIAGNOSIS — R3915 Urgency of urination: Secondary | ICD-10-CM | POA: Diagnosis not present

## 2022-03-03 ENCOUNTER — Ambulatory Visit: Payer: Medicare HMO | Admitting: Cardiovascular Disease

## 2022-03-14 ENCOUNTER — Encounter: Payer: Self-pay | Admitting: Cardiovascular Disease

## 2022-03-14 ENCOUNTER — Ambulatory Visit: Payer: Medicare HMO | Admitting: Cardiovascular Disease

## 2022-03-14 ENCOUNTER — Ambulatory Visit
Admission: RE | Admit: 2022-03-14 | Discharge: 2022-03-14 | Disposition: A | Discharge status: Home / Self Care | Payer: Medicare HMO | Source: Ambulatory Visit | Attending: Cardiovascular Disease | Admitting: Cardiovascular Disease

## 2022-03-14 VITALS — BP 123/61 | HR 78 | Ht 62.5 in | Wt 120.8 lb

## 2022-03-14 DIAGNOSIS — R0989 Other specified symptoms and signs involving the circulatory and respiratory systems: Secondary | ICD-10-CM | POA: Diagnosis not present

## 2022-03-14 DIAGNOSIS — R0602 Shortness of breath: Secondary | ICD-10-CM | POA: Diagnosis not present

## 2022-03-14 DIAGNOSIS — R9431 Abnormal electrocardiogram [ECG] [EKG]: Secondary | ICD-10-CM | POA: Diagnosis not present

## 2022-03-14 DIAGNOSIS — R072 Precordial pain: Secondary | ICD-10-CM | POA: Diagnosis not present

## 2022-03-14 DIAGNOSIS — E7800 Pure hypercholesterolemia, unspecified: Secondary | ICD-10-CM | POA: Diagnosis not present

## 2022-03-14 DIAGNOSIS — R059 Cough, unspecified: Secondary | ICD-10-CM | POA: Diagnosis not present

## 2022-03-14 DIAGNOSIS — I342 Nonrheumatic mitral (valve) stenosis: Secondary | ICD-10-CM

## 2022-03-14 MED ORDER — METOPROLOL TARTRATE 50 MG PO TABS: ORAL_TABLET | ORAL | 0 refills | Status: DC

## 2022-03-14 NOTE — Patient Instructions (Signed)
Medication Instructions:  ?No changes ?*If you need a refill on your cardiac medications before your next appointment, please call your pharmacy* ? ? ?Lab Work: ?Your provider would like for you to have the following labs today: BMET ? ?If you have labs (blood work) drawn today and your tests are completely normal, you will receive your results only by: ?MyChart Message (if you have MyChart) OR ?A paper copy in the mail ?If you have any lab test that is abnormal or we need to change your treatment, we will call you to review the results. ? ? ?Testing/Procedures: ?A chest x-ray takes a picture of the organs and structures inside the chest, including the heart, lungs, and blood vessels. This test can show several things, including, whether the heart is enlarges; whether fluid is building up in the lungs; and whether pacemaker / defibrillator leads are still in place. ? ?This can be done at [address] at [facility]. You do not need an appointment.  ? ? ?Follow-Up: ?At [facility], you and your health needs are our priority.  As part of our continuing mission to provide you with exceptional heart care, we have created designated Provider Care Teams.  These Care Teams include your primary Cardiologist (physician) and Advanced Practice Providers (APPs -  Physician Assistants and Nurse Practitioners) who all work together to provide you with the care you need, when you need it. ? ?We recommend signing up for the patient portal called "MyChart".  Sign up information is provided on this After Visit Summary.  MyChart is used to connect with patients for Virtual Visits (Telemedicine).  Patients are able to view lab/test results, encounter notes, upcoming appointments, etc.  Non-urgent messages can be sent to your provider as well.   ?To learn more about what you can do with MyChart, go to [email or web address].   ? ?Your next appointment:   ?Follow up pending results.  ? ? ?Other Instructions ? ? ?Your  cardiac CT will be scheduled at one of the below locations:  ? ?[facility] ?[address] ?[place], [place] [address] ?[phone] ? ?OR ? ?[place] ?[address] ?[address] ?[place],  [address] ?[phone] ? ?If scheduled at [facility], please arrive at the [facility] Entrance (Entrance C2) of [facility] 30 minutes prior to test start time. ?You can use the FREE valet parking offered at entrance C (encouraged to control the heart rate for the test)  ?Proceed to the [facility] Radiology Department (first floor) to check-in and test prep. ? ?All radiology patients and guests should use entrance C2 at [facility], accessed from [facility], even though the hospital's physical address listed is [address]. ? ? ? ?If scheduled at [facility], please arrive 15 mins early for check-in and test prep. ? ?Please follow these instructions carefully (unless otherwise directed): ? ? ?On the Night Before the Test: ?Be sure to Drink plenty of water. ?Do not consume any caffeinated/decaffeinated beverages or chocolate 12 hours prior to your test. ?Do not take any antihistamines 12 hours prior to your test. ? ?On the Day of the Test: ?Drink plenty of water until 1 hour prior to the test. ?Do not eat any food 4 hours prior to the test. ?You may take your regular medications prior to the test.  ?Take metoprolol (Lopressor) two hours prior to test. ?HOLD Furosemide/Hydrochlorothiazide morning of the test. ?FEMALES- please  wear underwire-free bra if available, avoid dresses & tight clothing ?     ?After the Test: ?Drink plenty of water. ?After receiving IV contrast, you may experience a mild flushed feeling. This is normal. ?On occasion, you may experience a mild rash up to 24 hours after the test. This is not dangerous. If this occurs, you can take Benadryl 25 mg and increase your  fluid intake. ?If you experience trouble breathing, this can be serious. If it is severe call 911 IMMEDIATELY. If it is mild, please call our office. ?If you take any of these medications: Glipizide/Metformin, Avandament, Glucavance, please do not take 48 hours after completing test unless otherwise instructed. ? ?We will call to schedule your test 2-4 weeks out understanding that some insurance companies will need an authorization prior to the service being performed.  ? ?For non-scheduling related questions, please contact the cardiac imaging nurse navigator should you have any questions/concerns: ?Marchia Bond, Cardiac Imaging Nurse Navigator ?Gordy Clement, Cardiac Imaging Nurse Navigator ?[facility] ?Direct Office Dial: [phone]  ? ?For scheduling needs, including cancellations and rescheduling, please call [place], [phone]. ? ? ? ? ?

## 2022-03-14 NOTE — Progress Notes (Signed)
?Cardiology Consultation Note:   ? ?Date:  03/16/2022  ? ?ID:  Stephanie Carney, DOB 06/26/1932, MRN [id number] ? ?PCP:  Hoyt Koch, MD  ?Cardiologist:  Sanda Klein, MD  ? ?Referring MD: Hoyt Koch, *  ? ?Stephanie Carney is a 88 y.o. female who is being seen today for the evaluation of dyspnea at the request of Hoyt Koch, MD. ? ?History of Present Illness:   ? ?Stephanie Carney is a 88 y.o. female with a hx of cardiac murmur and echo evidence of mild-moderate mitral valve stenosis.  This is her first cardiology visit since 2019. ? ?She generally done quite well in the intervening 4 years until June 2022 when she had COVID infection.  She did not require hospitalization.  After that episode, she experienced chest pressure and weakness including near syncope when walking.  The symptoms gradually improved and finally abated 6 months after the infection.  In January she thinks she had a different respiratory infection and had a productive cough that lasted for weeks.  Her sputum when initially pink and then became brown.  She continues to have exertional dyspnea, mostly NYHA functional class II.  She has not had chest pressure recently, but has limited her activity.  She does not have chest discomfort at rest..  She has not experienced full syncope and denies palpitations.  She does not have lower extremity edema.  She habitually sleeps on 2 pillows due to acid reflux and does not have frank paroxysmal nocturnal dyspnea. ? ?Her only other active medical problem is open-angle glaucoma which is well controlled.  She does not have hypertension, diabetes mellitus, significant dyslipidemia (LDL is mildly elevated at 125, but excellent HDL in the 70s), history of CAD, PAD or stroke.  She has a chronically abnormal ECG that shows small Q waves in leads I, aVL, II, III and aVF and left axis deviation.  This is unchanged from previous tracings.  Evaluation with echocardiogram in 2019 did not  show regional wall motion abnormalities that would support a previous inferoposterior infarction, so the EKG changes may be related to a conduction abnormality (atypical left anterior fascicular block). ? ?Chest x-ray ordered after this appointment shows mild bibasilar interstitial scarring or fibrosis, no evidence of cardiomegaly or heart failure.  The only available imaging for comparison is from 2011 and was normal. ? ?The echocardiogram in 2019 was performed due to the presence of a systolic murmur and showed a vigorous left ventricle with EF 65-70%, abnormal relaxation pattern of the mitral inflow, calcified mitral annulus with mildly elevated mean gradient across the mitral valve at 7 mmHg pressure half-time 96 ms, heart rate not reported but was around 80 bpm).  On my review of images there was evidence of aortic valve sclerosis, although this was not reported.  That same year, carotid duplex ultrasound performed for bruits showed mild plaque without obstruction. ? ? ? ?Past Medical History:  ?Diagnosis Date  ? Acid reflux 02/10/2011  ? Arthritis   ? Asthma   ? Breast cancer ([place]) 02/10/2011  ? left breast  ? Bruises easily 02/10/2011  ? Cancer [facility])   ? Cervical cancer ([place]) 02/10/2011  ? Diabetes mellitus   ? Fibromyalgia 02/10/2011  ? Glaucoma   ? Goiter 2014  ? Joint pain 02/10/2011  ? Night sweats 02/10/2011  ? Osteoporosis 04/10/2018  ? Shingles   ? Weight increase 02/10/2011  ? ? ?Past Surgical History:  ?Procedure Laterality Date  ? BREAST LUMPECTOMY  02/2011  ? left  ? CERVICAL CONE BIOPSY    ? LEG SURGERY  may 2011  ? SPINE SURGERY    ? UTERINE SUSPENSION    ? Growth/mass removal  ? ? ?Current Medications: ?Current Meds  ?Medication Sig  ? aspirin 81 MG tablet Take 81 mg by mouth daily.    ? betaxolol (BETOPTIC-S) 0.25 % ophthalmic suspension Place 1 drop into both eyes 2 (two) times daily.  ? brinzolamide (AZOPT) 1 % ophthalmic suspension Place 1 drop into both eyes 3 (three) times daily.  ? COD LIVER OIL PO  Take by mouth.  ? metoprolol tartrate (LOPRESSOR) 50 MG tablet Take one tablet two hours prior to the test.  ? MULTIPLE VITAMIN PO Take 1 tablet by mouth daily.  ? Multiple Vitamins-Minerals (EYE VITAMINS) CAPS Take by mouth.  ? Vitamin Mixture (ESTER-C PO) Take by mouth.  ? XALATAN 0.005 % ophthalmic solution Place 1 drop into both eyes daily.  ? [DISCONTINUED] AZOPT 1 % ophthalmic suspension BID times 48H.  ?  ? ?Allergies:   Latanoprost, Bactrim, Doxycycline, Tetracyclines & related, Ciprofloxacin, Macrobid [nitrofurantoin macrocrystal], and Latex  ? ?Social History  ? ?Socioeconomic History  ? Marital status: Divorced  ?  Spouse name: Not on file  ? Number of children: Not on file  ? Years of education: Not on file  ? Highest education level: Not on file  ?Occupational History  ? Not on file  ?Tobacco Use  ? Smoking status: Never  ? Smokeless tobacco: Never  ?Substance and Sexual Activity  ? Alcohol use: No  ? Drug use: No  ? Sexual activity: Yes  ?  Birth control/protection: Post-menopausal  ?Other Topics Concern  ? Not on file  ?Social History Narrative  ? Not on file  ? ?Social Determinants of Health  ? ?Financial Resource Strain: Not on file  ?Food Insecurity: Not on file  ?Transportation Needs: Not on file  ?Physical Activity: Not on file  ?Stress: Not on file  ?Social Connections: Not on file  ?  ? ?Family History: ?The patient's family history includes Cancer in her brother and father. ? ?ROS:   ?Please see the history of present illness.    ? All other systems reviewed and are negative. ? ?EKGs/Labs/Other Studies Reviewed:   ? ?The following studies were reviewed today: ?Echocardiogram 2019 ?- Left ventricle: The cavity size was normal. There was moderate  ?  concentric hypertrophy. Systolic function was vigorous. The  ?  estimated ejection fraction was in the range of 65% to 70%. Wall  ?  motion was normal; there were no regional wall motion  ?  abnormalities. Doppler parameters are consistent with  abnormal  ?  left ventricular relaxation (grade 1 diastolic dysfunction). The  ?  E/e&' ratio is >15, suggesting elevated LV filling pressure.  ?- Mitral valve: Calcified annulus. Mildly thickened leaflets . Mild  ?  to moderate stenosis. Mean gradient (D): 7 mm Hg. Valve area by  ?  pressure half-time: 2.39 cm^2. Indexed valve area by pressure  ?  half-time: 1.52 cm^2/m^2. Valve area by continuity equation  ?  (using LVOT flow): 1.43 cm^2.  ?- Left atrium: The atrium was normal in size.  ?- Atrial septum: A patent foramen ovale cannot be excluded.  ?- Tricuspid valve: There was trivial regurgitation.  ?- Pulmonary arteries: PA peak pressure: 41 mm Hg (S).  ?- Inferior vena cava: The vessel was normal in size. The  ?  respirophasic diameter changes were  in the normal range (= 50%),  ?  consistent with normal central venous pressure.  ? ?Impressions:  ? ?- LVEF 65-70%, moderate LVH, noraml wall motion, grade 1 DD,  ?  elevated LV filling pressure, MAC with mild to moderate mitral  ?  stenosis, normal LA size, cannot exclude PFO, trivial TR, RVSP 41  ?  mmHg, normal IVC.  ? ?EKG:  EKG is ordered today.  The ekg ordered today demonstrates sinus rhythm, prominent inferior Q waves and tall R waves in leads V1-V2 anteroseptal T wave inversion ? ?Recent Labs: ?03/14/2022: BUN 23; Creatinine, Ser 0.87; Potassium 4.8; Sodium 141  ?Recent Lipid Panel ?   ?Component Value Date/Time  ? CHOL 200 02/12/2018 1535  ? TRIG 73.0 02/12/2018 1535  ? HDL 60.70 02/12/2018 1535  ? CHOLHDL 3 02/12/2018 1535  ? VLDL 14.6 02/12/2018 1535  ? [place] 125 (H) 02/12/2018 1535  ? ? ?Physical Exam:   ? ?VS:  BP 123/61 (BP Location: Left Arm, Patient Position: Sitting, Cuff Size: Normal)   Pulse 78   Ht 5' 2.5" (1.588 m)   Wt 120 lb 12.8 oz (54.8 kg)   SpO2 96%   BMI 21.74 kg/m?    ?Recheck blood pressure 127/80 mmHg ?Wt Readings from Last 3 Encounters:  ?03/14/22 120 lb 12.8 oz (54.8 kg)  ?03/30/20 121 lb (54.9 kg)  ?01/13/20 132 lb (59.9 kg)   ?  ? ? ?General: Alert, oriented x3, no distress, appears lean, fit and younger than her stated age ?Head: no evidence of trauma, PERRL, EOMI, no exophtalmos or lid lag, no myxedema, no xanthelasma; normal

## 2022-03-15 ENCOUNTER — Encounter: Payer: Self-pay | Admitting: *Deleted

## 2022-03-15 LAB — BASIC METABOLIC PANEL
BUN/Creatinine Ratio: 26 (ref 12–28)
BUN: 23 mg/dL (ref 8–27)
CO2: 22 mmol/L (ref 20–29)
Calcium: 9.6 mg/dL (ref 8.7–10.3)
Chloride: 104 mmol/L (ref 96–106)
Creatinine, Ser: 0.87 mg/dL (ref 0.57–1.00)
Glucose: 73 mg/dL (ref 70–99)
Potassium: 4.8 mmol/L (ref 3.5–5.2)
Sodium: 141 mmol/L (ref 134–144)
eGFR: 64 mL/min/{1.73_m2} (ref >59)

## 2022-03-16 ENCOUNTER — Encounter: Payer: Self-pay | Admitting: *Deleted

## 2022-04-04 ENCOUNTER — Telehealth (HOSPITAL_COMMUNITY): Payer: Self-pay | Admitting: Emergency Medicine

## 2022-04-04 NOTE — Telephone Encounter (Signed)
Reaching out to patient to offer assistance regarding upcoming cardiac imaging study; pt verbalizes understanding of appt date/time, parking situation and where to check in, pre-test NPO status and medications ordered, and verified current allergies; name and call back number provided for further questions should they arise ?Stephanie Bond RN Navigator Cardiac Imaging ?[facility] ?[phone] office ?[phone] cell ? ?Arrival 130 ?'50mg'$  metoprolol tartrate ?Denies iv issues ? ?

## 2022-04-05 ENCOUNTER — Ambulatory Visit (HOSPITAL_COMMUNITY)
Admission: RE | Admit: 2022-04-05 | Discharge: 2022-04-05 | Disposition: A | Discharge status: Home / Self Care | Payer: Medicare HMO | Source: Ambulatory Visit | Attending: Cardiovascular Disease | Admitting: Cardiovascular Disease

## 2022-04-05 DIAGNOSIS — R072 Precordial pain: Secondary | ICD-10-CM | POA: Diagnosis not present

## 2022-04-05 MED ORDER — NITROGLYCERIN 0.4 MG SL SUBL
SUBLINGUAL_TABLET | SUBLINGUAL | Status: AC
  Filled 2022-04-05: qty 2

## 2022-04-05 MED ORDER — IOHEXOL 350 MG/ML SOLN
100.0000 mL | Freq: Once | INTRAVENOUS | Status: AC | PRN
  Administered 2022-04-05: 100 mL via INTRAVENOUS

## 2022-04-05 MED ORDER — NITROGLYCERIN 0.4 MG SL SUBL
0.8000 mg | SUBLINGUAL_TABLET | Freq: Once | SUBLINGUAL | Status: AC
  Administered 2022-04-05: 0.8 mg via SUBLINGUAL

## 2022-04-11 ENCOUNTER — Other Ambulatory Visit: Payer: Self-pay | Admitting: *Deleted

## 2022-04-11 DIAGNOSIS — I342 Nonrheumatic mitral (valve) stenosis: Secondary | ICD-10-CM

## 2022-04-19 DIAGNOSIS — H401133 Primary open-angle glaucoma, bilateral, severe stage: Secondary | ICD-10-CM | POA: Diagnosis not present

## 2022-04-19 DIAGNOSIS — Z961 Presence of intraocular lens: Secondary | ICD-10-CM | POA: Diagnosis not present

## 2022-04-19 DIAGNOSIS — H4053X3 Glaucoma secondary to other eye disorders, bilateral, severe stage: Secondary | ICD-10-CM | POA: Diagnosis not present

## 2022-05-04 ENCOUNTER — Ambulatory Visit (HOSPITAL_COMMUNITY): Payer: Medicare HMO | Attending: Internal Medicine

## 2022-05-04 DIAGNOSIS — I342 Nonrheumatic mitral (valve) stenosis: Secondary | ICD-10-CM | POA: Insufficient documentation

## 2022-05-04 LAB — ECHOCARDIOGRAM COMPLETE
Area-P 1/2: 4.65 cm2
MV VTI: 1.06 cm2
S' Lateral: 2 cm

## 2022-05-18 ENCOUNTER — Telehealth: Payer: Self-pay | Admitting: Internal Medicine

## 2022-05-18 MED ORDER — METOPROLOL SUCCINATE ER 25 MG PO TB24: 25.0000 mg | ORAL_TABLET | Freq: Every day | ORAL | 11 refills | Status: DC

## 2022-05-18 NOTE — Telephone Encounter (Signed)
Patient was returning call. Please advise ?

## 2022-05-18 NOTE — Telephone Encounter (Signed)
Pt returning call.     Returned call to Pt to advise of echo results and recommendations.  She will try Toprol XL 25 mg PO daily.  Sent in 30 day supply for Pt to try.  Follow up appt made with Dr. Loletha Grayer for 05/19/2022 at 2:30 pm to discuss.  Pt thankful for call back and education.    Sanda Klein, MD  05/17/2022  2:19 PM EDT     The mitral stenosis is worse and may be part of the reason for her shortness of breath with activity. The adverse consequences of mitral stenosis are worse at faster heart rates. She should feel better if we keep her heart rate slowewr. Please start metoprolol sccinate 25 mg daily and schedule for an office follow up.

## 2022-05-19 ENCOUNTER — Encounter: Payer: Self-pay | Admitting: Cardiovascular Disease

## 2022-05-19 ENCOUNTER — Ambulatory Visit: Payer: Medicare HMO | Admitting: Cardiovascular Disease

## 2022-05-19 VITALS — BP 138/60 | HR 74 | Ht 62.5 in | Wt 121.8 lb

## 2022-05-19 DIAGNOSIS — R9431 Abnormal electrocardiogram [ECG] [EKG]: Secondary | ICD-10-CM

## 2022-05-19 DIAGNOSIS — I342 Nonrheumatic mitral (valve) stenosis: Secondary | ICD-10-CM | POA: Diagnosis not present

## 2022-05-19 DIAGNOSIS — E7800 Pure hypercholesterolemia, unspecified: Secondary | ICD-10-CM | POA: Diagnosis not present

## 2022-05-19 NOTE — Progress Notes (Signed)
Cardiology Consultation Note:    Date:  05/20/2022   ID:  Stephanie Carney, DOB 12-Mar-1932, MRN [id number]  PCP:  Hoyt Koch, MD  Cardiologist:  Sanda Klein, MD   Referring MD: Hoyt Koch, *   Chief Complaint  Patient presents with   Shortness of Breath   Cardiac Valve Problem     History of Present Illness:    Stephanie Carney is a 88 y.o. female with a hx of cardiac murmur and echo evidence of mild-moderate mitral valve stenosis.  This is her first cardiology visit since 2019.  She returns in follow-up after undergoing a coronary CT angiogram (no significant CAD, low calcium score) and an echocardiogram that showed severe mitral annular calcification with secondary mitral stenosis.  The mean mitral gradient was 15 mmHg at a heart rate of 84 bpm and the calculated mitral valve area was 1.1 cm by the continuity equation although the pressure half-time was not particularly prolonged.  There was also mild pulmonary artery hypertension with a calculated right ventricular systolic pressure of 37 mmHg.  She continues to correlate the onset of her dyspnea with her COVID-19 infection in June 2022.  She continues to have NYHA functional class II dyspnea on exertion and denies orthopnea, PND and lower extremity edema.  She has not had syncope or palpitations or chest pain.  She habitually sleeps on 2 pillows due to acid reflux.  Her only other active medical problem is open-angle glaucoma which is well controlled.  She does not have hypertension, diabetes mellitus, significant dyslipidemia (LDL is mildly elevated at 125, but excellent HDL in the 70s), history of CAD, PAD or stroke.  She has a chronically abnormal ECG that shows small Q waves in leads I, aVL, II, III and aVF and left axis deviation.  This is unchanged from previous tracings.  Evaluation with echocardiogram in 2019 did not show regional wall motion abnormalities that would support a previous inferoposterior  infarction, so the EKG changes may be related to a conduction abnormality (atypical left anterior fascicular block).   Past Medical History:  Diagnosis Date   Acid reflux 02/10/2011   Arthritis    Asthma    Breast cancer ([place]) 02/10/2011   left breast   Bruises easily 02/10/2011   Cancer ([place])    Cervical cancer ([place]) 02/10/2011   Diabetes mellitus    Fibromyalgia 02/10/2011   Glaucoma    Goiter 2014   Joint pain 02/10/2011   Night sweats 02/10/2011   Osteoporosis 04/10/2018   Shingles    Weight increase 02/10/2011    Past Surgical History:  Procedure Laterality Date   BREAST LUMPECTOMY  02/2011   left   CERVICAL CONE BIOPSY     LEG SURGERY  may 2011   SPINE SURGERY     UTERINE SUSPENSION     Growth/mass removal    Current Medications: Current Meds  Medication Sig   acetaminophen (TYLENOL) 325 MG tablet Take 325 mg by mouth as needed.   aspirin 81 MG tablet Take 81 mg by mouth daily.     betaxolol (BETOPTIC-S) 0.25 % ophthalmic suspension Place 1 drop into both eyes 2 (two) times daily.   brinzolamide (AZOPT) 1 % ophthalmic suspension Place 1 drop into both eyes 3 (three) times daily.   COD LIVER OIL PO Take by mouth.   metoprolol succinate (TOPROL XL) 25 MG 24 hr tablet Take 1 tablet (25 mg total) by mouth daily.   MULTIPLE VITAMIN PO Take 1  tablet by mouth daily.   Multiple Vitamins-Minerals (EYE VITAMINS) CAPS Take by mouth.   Vitamin Mixture (ESTER-C PO) Take by mouth.   XALATAN 0.005 % ophthalmic solution Place 1 drop into both eyes daily.     Allergies:   Latanoprost, Bactrim, Doxycycline, Tetracyclines & related, Ciprofloxacin, Macrobid [nitrofurantoin macrocrystal], and Latex   Social History   Socioeconomic History   Marital status: Divorced    Spouse name: Not on file   Number of children: Not on file   Years of education: Not on file   Highest education level: Not on file  Occupational History   Not on file  Tobacco Use   Smoking status: Never    Smokeless tobacco: Never  Substance and Sexual Activity   Alcohol use: No   Drug use: No   Sexual activity: Yes    Birth control/protection: Post-menopausal  Other Topics Concern   Not on file  Social History Narrative   Not on file   Social Determinants of Health   Financial Resource Strain: Not on file  Food Insecurity: Not on file  Transportation Needs: Not on file  Physical Activity: Not on file  Stress: Not on file  Social Connections: Not on file     Family History: The patient's family history includes Cancer in her brother and father.  ROS:   Please see the history of present illness.     All other systems reviewed and are negative.  EKGs/Labs/Other Studies Reviewed:    The following studies were reviewed today:  Coronary CT Angio 04/05/2022 1. Minimal CAD, CADRADS = 1. Mid LAD myocardial bridge with qualitatively mild compression in systole more evident than in diastole.   2. Coronary calcium score is 81.7, which places the patient in the 35th percentile for sex matched control. Age 25 is out of the validated range for calcium scoring, comparison made to 19 yo age-, sex-, and race-matched peers.   3. Normal coronary origins with right dominance.   4. Severe mitral annular calcification.   5. Severe left atrial enlargement, moderate right atrial enlargement by visual estimate. Echocardiogram 05/04/2022   1. Global longitudinal strain is -18.3%. Left ventricular ejection  fraction, by estimation, is >75%. The left ventricle has hyperdynamic  function. The left ventricle has no regional wall motion abnormalities.  There is mild left ventricular hypertrophy.  Left ventricular diastolic parameters are consistent with Grade II  diastolic dysfunction (pseudonormalization). Elevated left atrial  pressure.   2. Right ventricular systolic function is normal. The right ventricular  size is normal. There is mildly elevated pulmonary artery systolic  pressure.    3. Left atrial size was moderately dilated.   4. MV is thickened. There is severe annular calcification. Peak and mean  gradients through the valve are 31 and 15 mm HG respectively (HR 84 bpm)  MVA by continuity equation is 1.1 cm2 consistent with severe MS . Mild  mitral valve regurgitation. Severe  mitral annular calcification.   5. The aortic valve is tricuspid. Aortic valve regurgitation is not  visualized. Aortic valve sclerosis/calcification is present, without any  evidence of aortic stenosis.   6. The inferior vena cava is normal in size with greater than 50%  respiratory variability, suggesting right atrial pressure of 3 mmHg.   EKG:  EKG is not ordered today.  The ekg ordered 03/14/2022 was personally reviewed and demonstrates sinus rhythm, prominent inferior Q waves and tall R waves in leads V1-V2 anteroseptal T wave inversion  Recent Labs: 03/14/2022: BUN  23; Creatinine, Ser 0.87; Potassium 4.8; Sodium 141  Recent Lipid Panel    Component Value Date/Time   CHOL 200 02/12/2018 1535   TRIG 73.0 02/12/2018 1535   HDL 60.70 02/12/2018 1535   CHOLHDL 3 02/12/2018 1535   VLDL 14.6 02/12/2018 1535   LDLCALC 125 (H) 02/12/2018 1535    Physical Exam:    VS:  BP 138/60   Pulse 74   Ht 5' 2.5" (1.588 m)   Wt 121 lb 12.8 oz (55.2 kg)   SpO2 95%   BMI 21.92 kg/m    Recheck blood pressure 127/80 mmHg Wt Readings from Last 3 Encounters:  05/19/22 121 lb 12.8 oz (55.2 kg)  03/14/22 120 lb 12.8 oz (54.8 kg)  03/30/20 121 lb (54.9 kg)      General: Alert, oriented x3, no distress, appears younger than stated age Head: no evidence of trauma, PERRL, EOMI, no exophtalmos or lid lag, no myxedema, no xanthelasma; normal ears, nose and oropharynx Neck: normal jugular venous pulsations and no hepatojugular reflux; brisk carotid pulses without delay and bilateral carotid bruits Chest: clear to auscultation, no signs of consolidation by percussion or palpation, normal fremitus,  symmetrical and full respiratory excursions Cardiovascular: normal position and quality of the apical impulse, regular rhythm, normal first and second heart sounds, grade 2/6 early peaking aortic ejection murmur heard at the right upper sternal border and radiating to the carotids bilaterally, no diastolic rumbles or apical murmurs, rubs or gallops Abdomen: no tenderness or distention, no masses by palpation, no abnormal pulsatility or arterial bruits, normal bowel sounds, no hepatosplenomegaly Extremities: no clubbing, cyanosis or edema; 2+ radial, ulnar and brachial pulses bilaterally; 2+ right femoral, posterior tibial and dorsalis pedis pulses; 2+ left femoral, posterior tibial and dorsalis pedis pulses; no subclavian or femoral bruits Neurological: grossly nonfocal Psych: Normal mood and affect    ASSESSMENT:    1. Nonrheumatic mitral (valve) stenosis   2. Hypercholesterolemia   3. Nonspecific abnormal electrocardiogram (ECG) (EKG)      PLAN:    In order of problems listed above:  Mitral stenosis: Due to annular calcification, worsening compared to 2022.  Could well be the cause of her exertional dyspnea.  Discussed the pathophysiology of mitral stenosis and the fact that surgical replacement of the mitral valve at her age would be a difficult procedure, especially due to the exuberant nature of the annular calcification.  We will start treatment with beta-blockers and titrate this gradually to get a resting heart rate in the mid to high 50s.  After that we can add diuretics if she continues to have dyspnea or if she develops frank hypervolemia. Carotid bruits: Prominent, but not associated with meaningful carotid obstruction based on previous duplex ultrasound. HLP: Mildly elevated LDL with excellent HDL.  Calcium score was only 82 which for her age places her in the 35th percentile.  I do not think she needs lipid-lowering therapy. Abnormal ECG: ECG suggest previous infarction, but the  echo continues to show normal wall motion and overall excellent LVEF.  Suspect the abnormalities are due to a conduction defect.     Medication Adjustments/Labs and Tests Ordered: Current medicines are reviewed at length with the patient today.  Concerns regarding medicines are outlined above.  No orders of the defined types were placed in this encounter.  No orders of the defined types were placed in this encounter.   Patient Instructions  Medication Instructions:  No changes *If you need a refill on your cardiac medications  before your next appointment, please call your pharmacy*   Lab Work: None ordered If you have labs (blood work) drawn today and your tests are completely normal, you will receive your results only by: [place] (if you have MyChart) OR A paper copy in the mail If you have any lab test that is abnormal or we need to change your treatment, we will call you to review the results.   Testing/Procedures: None ordered   Follow-Up: At [facility], you and your health needs are our priority.  As part of our continuing mission to provide you with exceptional heart care, we have created designated Provider Care Teams.  These Care Teams include your primary Cardiologist (physician) and Advanced Practice Providers (APPs -  Physician Assistants and Nurse Practitioners) who all work together to provide you with the care you need, when you need it.  We recommend signing up for the patient portal called "MyChart".  Sign up information is provided on this After Visit Summary.  MyChart is used to connect with patients for Virtual Visits (Telemedicine).  Patients are able to view lab/test results, encounter notes, upcoming appointments, etc.  Non-urgent messages can be sent to your provider as well.   To learn more about what you can do with MyChart, go to [email or web address].    Your next appointment:   3 month(s)  The format for your next appointment:   In  Person  Provider:   Sanda Klein, MD {    Important Information About Sugar         Signed, Sanda Klein, MD  05/20/2022 4:42 PM    [place]

## 2022-05-19 NOTE — Patient Instructions (Signed)
Medication Instructions:  No changes *If you need a refill on your cardiac medications before your next appointment, please call your pharmacy*   Lab Work: None ordered If you have labs (blood work) drawn today and your tests are completely normal, you will receive your results only by: [place] (if you have MyChart) OR A paper copy in the mail If you have any lab test that is abnormal or we need to change your treatment, we will call you to review the results.   Testing/Procedures: None ordered   Follow-Up: At [facility], you and your health needs are our priority.  As part of our continuing mission to provide you with exceptional heart care, we have created designated Provider Care Teams.  These Care Teams include your primary Cardiologist (physician) and Advanced Practice Providers (APPs -  Physician Assistants and Nurse Practitioners) who all work together to provide you with the care you need, when you need it.  We recommend signing up for the patient portal called "MyChart".  Sign up information is provided on this After Visit Summary.  MyChart is used to connect with patients for Virtual Visits (Telemedicine).  Patients are able to view lab/test results, encounter notes, upcoming appointments, etc.  Non-urgent messages can be sent to your provider as well.   To learn more about what you can do with MyChart, go to [email or web address].    Your next appointment:   3 month(s)  The format for your next appointment:   In Person  Provider:   Sanda Klein, MD {    Important Information About Sugar

## 2022-05-20 ENCOUNTER — Encounter: Payer: Self-pay | Admitting: Cardiovascular Disease

## 2022-05-31 ENCOUNTER — Other Ambulatory Visit: Payer: Self-pay | Admitting: Internal Medicine

## 2022-05-31 DIAGNOSIS — Z1231 Encounter for screening mammogram for malignant neoplasm of breast: Secondary | ICD-10-CM

## 2022-06-09 ENCOUNTER — Ambulatory Visit
Admission: RE | Admit: 2022-06-09 | Discharge: 2022-06-09 | Disposition: A | Discharge status: Home / Self Care | Payer: Medicare HMO | Source: Ambulatory Visit | Attending: Internal Medicine | Admitting: Internal Medicine

## 2022-06-09 DIAGNOSIS — Z1231 Encounter for screening mammogram for malignant neoplasm of breast: Secondary | ICD-10-CM | POA: Diagnosis not present

## 2022-06-29 DIAGNOSIS — H26491 Other secondary cataract, right eye: Secondary | ICD-10-CM | POA: Diagnosis not present

## 2022-07-05 DIAGNOSIS — H353130 Nonexudative age-related macular degeneration, bilateral, stage unspecified: Secondary | ICD-10-CM | POA: Diagnosis not present

## 2022-07-05 DIAGNOSIS — H401133 Primary open-angle glaucoma, bilateral, severe stage: Secondary | ICD-10-CM | POA: Diagnosis not present

## 2022-07-05 DIAGNOSIS — Z961 Presence of intraocular lens: Secondary | ICD-10-CM | POA: Diagnosis not present

## 2022-07-05 DIAGNOSIS — H4053X3 Glaucoma secondary to other eye disorders, bilateral, severe stage: Secondary | ICD-10-CM | POA: Diagnosis not present

## 2022-08-30 ENCOUNTER — Ambulatory Visit (INDEPENDENT_AMBULATORY_CARE_PROVIDER_SITE_OTHER): Payer: Medicare HMO

## 2022-08-30 ENCOUNTER — Encounter: Payer: Self-pay | Admitting: Cardiovascular Disease

## 2022-08-30 ENCOUNTER — Ambulatory Visit: Payer: Medicare HMO | Attending: Cardiovascular Disease | Admitting: Cardiovascular Disease

## 2022-08-30 VITALS — BP 98/52 | HR 95 | Ht 62.5 in | Wt 120.8 lb

## 2022-08-30 DIAGNOSIS — R002 Palpitations: Secondary | ICD-10-CM

## 2022-08-30 DIAGNOSIS — I342 Nonrheumatic mitral (valve) stenosis: Secondary | ICD-10-CM | POA: Diagnosis not present

## 2022-08-30 NOTE — Progress Notes (Signed)
Cardiology Consultation Note:    Date:  08/31/2022   ID:  Stephanie Carney, DOB 09-01-1932, MRN [id number]  PCP:  Hoyt Koch, MD  Cardiologist:  Sanda Klein, MD   Referring MD: Hoyt Koch, *   No chief complaint on file.    History of Present Illness:    Stephanie Carney is a 88 y.o. female with mild-moderate mitral gnosis due to severe annular calcification, mild PAH (estimated systolic PA pressure 37 mmHg).  Prescribed beta-blocker therapy, but she did not really notice an improvement in dyspnea and felt very tired.  Stopped it and felt better.  Continues to have NYHA functional class II exertional dyspnea (the onset of which she actually correlates with her COVID-19 infection in June 2022).  04/05/2022 coronary CT angiogram showed no significant CAD, low calcium score and 05/04/2022 echocardiogram showed severe mitral annular calcification with secondary mitral stenosis.  The mean mitral gradient was 15 mmHg at a heart rate of 84 bpm and the calculated mitral valve area was 1.1 cm by the continuity equation although the pressure half-time was not particularly prolonged.  There was also mild pulmonary artery hypertension with a calculated right ventricular systolic pressure of 37 mmHg.  She does not have orthopnea, PND or lower extremity edema.  She has occasional palpitations that sound like isolated beats rather than sustained arrhythmia.  She denies syncope.  She habitually sleeps on 2 pillows due to acid reflux.  She has not had any falls or bleeding problems.  There has been no significant change in her weight.  She has never complained of chest discomfort at rest or with activity.  Her only other active medical problem is open-angle glaucoma which is well controlled.  She does not have hypertension, diabetes mellitus, significant dyslipidemia (LDL is mildly elevated at 125, but excellent HDL in the 70s), history of CAD, PAD or stroke.  She has a chronically  abnormal ECG that shows small Q waves in leads I, aVL, II, III and aVF and left axis deviation.  This is unchanged from previous tracings.  Evaluation with echocardiogram in 2019 did not show regional wall motion abnormalities that would support a previous inferoposterior infarction, so the EKG changes may be related to a conduction abnormality (atypical left anterior fascicular block).   Past Medical History:  Diagnosis Date   Acid reflux 02/10/2011   Arthritis    Asthma    Breast cancer ([place]) 02/10/2011   left breast   Bruises easily 02/10/2011   Cancer ([place])    Cervical cancer ([place]) 02/10/2011   Diabetes mellitus    Fibromyalgia 02/10/2011   Glaucoma    Goiter 2014   Joint pain 02/10/2011   Night sweats 02/10/2011   Osteoporosis 04/10/2018   Shingles    Weight increase 02/10/2011    Past Surgical History:  Procedure Laterality Date   BREAST LUMPECTOMY  02/2011   left   CERVICAL CONE BIOPSY     LEG SURGERY  may 2011   SPINE SURGERY     UTERINE SUSPENSION     Growth/mass removal    Current Medications: Current Meds  Medication Sig   acetaminophen (TYLENOL) 325 MG tablet Take 325 mg by mouth as needed.   aspirin 81 MG tablet Take 81 mg by mouth daily.     betaxolol (BETOPTIC-S) 0.25 % ophthalmic suspension Place 1 drop into both eyes 2 (two) times daily.   brinzolamide (AZOPT) 1 % ophthalmic suspension Place 1 drop into both eyes 3 (three)  times daily.   COD LIVER OIL PO Take by mouth.   MULTIPLE VITAMIN PO Take 1 tablet by mouth daily.   Multiple Vitamins-Minerals (EYE VITAMINS) CAPS Take by mouth.   Vitamin Mixture (ESTER-C PO) Take by mouth.   XALATAN 0.005 % ophthalmic solution Place 1 drop into both eyes daily.     Allergies:   Latanoprost, Bactrim, Doxycycline, Tetracyclines & related, Ciprofloxacin, Macrobid [nitrofurantoin macrocrystal], and Latex   Social History   Socioeconomic History   Marital status: Divorced    Spouse name: Not on file   Number of children:  Not on file   Years of education: Not on file   Highest education level: Not on file  Occupational History   Not on file  Tobacco Use   Smoking status: Never   Smokeless tobacco: Never  Substance and Sexual Activity   Alcohol use: No   Drug use: No   Sexual activity: Yes    Birth control/protection: Post-menopausal  Other Topics Concern   Not on file  Social History Narrative   Not on file   Social Determinants of Health   Financial Resource Strain: Not on file  Food Insecurity: Not on file  Transportation Needs: Not on file  Physical Activity: Not on file  Stress: Not on file  Social Connections: Not on file     Family History: The patient's family history includes Cancer in her brother and father.  ROS:   Please see the history of present illness.     All other systems reviewed and are negative.  EKGs/Labs/Other Studies Reviewed:    The following studies were reviewed today:  Coronary CT Angio 04/05/2022 1. Minimal CAD, CADRADS = 1. Mid LAD myocardial bridge with qualitatively mild compression in systole more evident than in diastole.   2. Coronary calcium score is 81.7, which places the patient in the 35th percentile for sex matched control. Age 60 is out of the validated range for calcium scoring, comparison made to 65 yo age-, sex-, and race-matched peers.   3. Normal coronary origins with right dominance.   4. Severe mitral annular calcification.   5. Severe left atrial enlargement, moderate right atrial enlargement by visual estimate. Echocardiogram 05/04/2022   1. Global longitudinal strain is -18.3%. Left ventricular ejection  fraction, by estimation, is >75%. The left ventricle has hyperdynamic  function. The left ventricle has no regional wall motion abnormalities.  There is mild left ventricular hypertrophy.  Left ventricular diastolic parameters are consistent with Grade II  diastolic dysfunction (pseudonormalization). Elevated left atrial   pressure.   2. Right ventricular systolic function is normal. The right ventricular  size is normal. There is mildly elevated pulmonary artery systolic  pressure.   3. Left atrial size was moderately dilated.   4. MV is thickened. There is severe annular calcification. Peak and mean  gradients through the valve are 31 and 15 mm HG respectively (HR 84 bpm)  MVA by continuity equation is 1.1 cm2 consistent with severe MS . Mild  mitral valve regurgitation. Severe  mitral annular calcification.   5. The aortic valve is tricuspid. Aortic valve regurgitation is not  visualized. Aortic valve sclerosis/calcification is present, without any  evidence of aortic stenosis.   6. The inferior vena cava is normal in size with greater than 50%  respiratory variability, suggesting right atrial pressure of 3 mmHg.   EKG:  EKG is not ordered today.  The ekg ordered 03/14/2022 was personally reviewed and demonstrates sinus rhythm, prominent inferior  Q waves and tall R waves in leads V1-V2 anteroseptal T wave inversion  Recent Labs: 03/14/2022: BUN 23; Creatinine, Ser 0.87; Potassium 4.8; Sodium 141  Recent Lipid Panel    Component Value Date/Time   CHOL 200 02/12/2018 1535   TRIG 73.0 02/12/2018 1535   HDL 60.70 02/12/2018 1535   CHOLHDL 3 02/12/2018 1535   VLDL 14.6 02/12/2018 1535   LDLCALC 125 (H) 02/12/2018 1535    Physical Exam:    VS:  BP (!) 98/52 (BP Location: Left Arm, Patient Position: Sitting, Cuff Size: Normal)   Pulse 95   Ht 5' 2.5" (1.588 m)   Wt 120 lb 12.8 oz (54.8 kg)   SpO2 99%   BMI 21.74 kg/m    Recheck blood pressure 127/80 mmHg Wt Readings from Last 3 Encounters:  08/30/22 120 lb 12.8 oz (54.8 kg)  05/19/22 121 lb 12.8 oz (55.2 kg)  03/14/22 120 lb 12.8 oz (54.8 kg)      General: Alert, oriented x3, no distress, appears substantially younger than stated age Head: no evidence of trauma, PERRL, EOMI, no exophtalmos or lid lag, no myxedema, no xanthelasma; normal  ears, nose and oropharynx Neck: normal jugular venous pulsations and no hepatojugular reflux; brisk carotid pulses without delay and prominent bilateral carotid bruits Chest: clear to auscultation, no signs of consolidation by percussion or palpation, normal fremitus, symmetrical and full respiratory excursions Cardiovascular: normal position and quality of the apical impulse, regular rhythm, normal first and second heart sounds,-2/6 early peaking aortic ejection murmur at the right upper sternal border, no diastolic rumble's or murmurs, rubs or gallops Abdomen: no tenderness or distention, no masses by palpation, no abnormal pulsatility or arterial bruits, normal bowel sounds, no hepatosplenomegaly Extremities: no clubbing, cyanosis or edema; 2+ radial, ulnar and brachial pulses bilaterally; 2+ right femoral, posterior tibial and dorsalis pedis pulses; 2+ left femoral, posterior tibial and dorsalis pedis pulses; no subclavian or femoral bruits Neurological: grossly nonfocal Psych: Normal mood and affect   ASSESSMENT:    1. Mitral valve stenosis, non-rheumatic   2. Palpitations       PLAN:    In order of problems listed above:  Mitral stenosis: Due to annular calcification, worsening compared to 2022.  Attempted treatment with beta-blockers, but this caused more worsening fatigue minute led to any improvement in dyspnea.  Does not have overt hypervolemia so at this point diuretics appear premature.  Discussed the fact that this is likely to be a very slowly progressive disorder and that abrupt decompensation is unlikely unless she develops arrhythmia (atrial fibrillation in particular).  Since she has been having some palpitations we will go ahead and have her wear a monitor. Carotid bruits: Prominent, but not associated with meaningful carotid obstruction based on previous duplex ultrasound. HLP: Mildly elevated LDL with excellent HDL.  Calcium score was only 82 which for her age places her  in the 35th percentile.  I do not think she needs lipid-lowering therapy. Abnormal ECG: ECG suggest previous infarction, but the echo continues to show normal wall motion and overall excellent LVEF.  Suspect the abnormalities are due to a conduction defect.     Medication Adjustments/Labs and Tests Ordered: Current medicines are reviewed at length with the patient today.  Concerns regarding medicines are outlined above.  Orders Placed This Encounter  Procedures   LONG TERM MONITOR (3-14 DAYS)   No orders of the defined types were placed in this encounter.   Patient Instructions  Medication Instructions:  No changes *  If you need a refill on your cardiac medications before your next appointment, please call your pharmacy*   Lab Work: None ordered If you have labs (blood work) drawn today and your tests are completely normal, you will receive your results only by: [place] (if you have MyChart) OR A paper copy in the mail If you have any lab test that is abnormal or we need to change your treatment, we will call you to review the results.   Testing/Procedures: None ordered   Follow-Up: At [facility], you and your health needs are our priority.  As part of our continuing mission to provide you with exceptional heart care, we have created designated Provider Care Teams.  These Care Teams include your primary Cardiologist (physician) and Advanced Practice Providers (APPs -  Physician Assistants and Nurse Practitioners) who all work together to provide you with the care you need, when you need it.  We recommend signing up for the patient portal called "MyChart".  Sign up information is provided on this After Visit Summary.  MyChart is used to connect with patients for Virtual Visits (Telemedicine).  Patients are able to view lab/test results, encounter notes, upcoming appointments, etc.  Non-urgent messages can be sent to your provider as well.   To learn more about  what you can do with MyChart, go to [email or web address].    Your next appointment:   6 month(s)  The format for your next appointment:   In Person  Provider:   Sanda Klein, MD     Other Instructions Bryn Gulling- Long Term Monitor Instructions  Your physician has requested you wear a ZIO patch monitor for 14 days.  This is a single patch monitor. [employer] supplies one patch monitor per enrollment. Additional stickers are not available. Please do not apply patch if you will be having a Nuclear Stress Test,  Echocardiogram, Cardiac CT, MRI, or Chest Xray during the period you would be wearing the  monitor. The patch cannot be worn during these tests. You cannot remove and re-apply the  ZIO XT patch monitor.  Your ZIO patch monitor will be mailed 3 day USPS to your address on file. It may take 3-5 days  to receive your monitor after you have been enrolled.  Once you have received your monitor, please review the enclosed instructions. Your monitor  has already been registered assigning a specific monitor serial # to you.  Billing and Patient Assistance Program Information  We have supplied [employer] with any of your insurance information on file for billing purposes. [employer] offers a sliding scale Patient Assistance Program for patients that do not have  insurance, or whose insurance does not completely cover the cost of the ZIO monitor.  You must apply for the Patient Assistance Program to qualify for this discounted rate.  To apply, please call [employer] at [phone], select option 4, select option 2, ask to apply for  Patient Assistance Program. Theodore Demark will ask your household income, and how many people  are in your household. They will quote your out-of-pocket cost based on that information.  [employer] will also be able to set up a 32-month interest-free payment plan if needed.  Applying the monitor   Shave hair from upper left chest.  Hold abrader disc by orange tab. Rub  abrader in 40 strokes over the upper left chest as  indicated in your monitor instructions.  Clean area with 4 enclosed alcohol pads. Let dry.  Apply patch as indicated in monitor instructions.  Patch will be placed under collarbone on left  side of chest with arrow pointing upward.  Rub patch adhesive wings for 2 minutes. Remove white label marked "1". Remove the white  label marked "2". Rub patch adhesive wings for 2 additional minutes.  While looking in a mirror, press and release button in center of patch. A small green light will  flash 3-4 times. This will be your only indicator that the monitor has been turned on.  Do not shower for the first 24 hours. You may shower after the first 24 hours.  Press the button if you feel a symptom. You will hear a small click. Record Date, Time and  Symptom in the Patient Logbook.  When you are ready to remove the patch, follow instructions on the last 2 pages of Patient  Logbook. Stick patch monitor onto the last page of Patient Logbook.  Place Patient Logbook in the blue and white box. Use locking tab on box and tape box closed  securely. The blue and white box has prepaid postage on it. Please place it in the mailbox as  soon as possible. Your physician should have your test results approximately 7 days after the  monitor has been mailed back to [facility].  Call [place] at [phone] if you have questions regarding  your ZIO XT patch monitor. Call them immediately if you see an orange light blinking on your  monitor.  If your monitor falls off in less than 4 days, contact our Monitor department at [phone].  If your monitor becomes loose or falls off after 4 days call [employer] at [phone] for  suggestions on securing your monitor        Signed, Sanda Klein, MD  08/31/2022 9:21 AM    [place]

## 2022-08-30 NOTE — Progress Notes (Unsigned)
Enrolled for [employer] to mail a ZIO XT long term holter monitor to the patients address on file.  

## 2022-08-30 NOTE — Patient Instructions (Signed)
Medication Instructions:  No changes *If you need a refill on your cardiac medications before your next appointment, please call your pharmacy*   Lab Work: None ordered If you have labs (blood work) drawn today and your tests are completely normal, you will receive your results only by: [place] (if you have MyChart) OR A paper copy in the mail If you have any lab test that is abnormal or we need to change your treatment, we will call you to review the results.   Testing/Procedures: None ordered   Follow-Up: At [facility], you and your health needs are our priority.  As part of our continuing mission to provide you with exceptional heart care, we have created designated Provider Care Teams.  These Care Teams include your primary Cardiologist (physician) and Advanced Practice Providers (APPs -  Physician Assistants and Nurse Practitioners) who all work together to provide you with the care you need, when you need it.  We recommend signing up for the patient portal called "MyChart".  Sign up information is provided on this After Visit Summary.  MyChart is used to connect with patients for Virtual Visits (Telemedicine).  Patients are able to view lab/test results, encounter notes, upcoming appointments, etc.  Non-urgent messages can be sent to your provider as well.   To learn more about what you can do with MyChart, go to [email or web address].    Your next appointment:   6 month(s)  The format for your next appointment:   In Person  Provider:   Sanda Klein, MD     Other Instructions Bryn Gulling- Long Term Monitor Instructions  Your physician has requested you wear a ZIO patch monitor for 14 days.  This is a single patch monitor. [employer] supplies one patch monitor per enrollment. Additional stickers are not available. Please do not apply patch if you will be having a Nuclear Stress Test,  Echocardiogram, Cardiac CT, MRI, or Chest Xray during the period you would  be wearing the  monitor. The patch cannot be worn during these tests. You cannot remove and re-apply the  ZIO XT patch monitor.  Your ZIO patch monitor will be mailed 3 day USPS to your address on file. It may take 3-5 days  to receive your monitor after you have been enrolled.  Once you have received your monitor, please review the enclosed instructions. Your monitor  has already been registered assigning a specific monitor serial # to you.  Billing and Patient Assistance Program Information  We have supplied [employer] with any of your insurance information on file for billing purposes. [employer] offers a sliding scale Patient Assistance Program for patients that do not have  insurance, or whose insurance does not completely cover the cost of the ZIO monitor.  You must apply for the Patient Assistance Program to qualify for this discounted rate.  To apply, please call [employer] at [phone], select option 4, select option 2, ask to apply for  Patient Assistance Program. Theodore Demark will ask your household income, and how many people  are in your household. They will quote your out-of-pocket cost based on that information.  [employer] will also be able to set up a 7-month interest-free payment plan if needed.  Applying the monitor   Shave hair from upper left chest.  Hold abrader disc by orange tab. Rub abrader in 40 strokes over the upper left chest as  indicated in your monitor instructions.  Clean area with 4 enclosed alcohol pads. Let dry.  Apply patch as  indicated in monitor instructions. Patch will be placed under collarbone on left  side of chest with arrow pointing upward.  Rub patch adhesive wings for 2 minutes. Remove white label marked "1". Remove the white  label marked "2". Rub patch adhesive wings for 2 additional minutes.  While looking in a mirror, press and release button in center of patch. A small green light will  flash 3-4 times. This will be your only indicator that the  monitor has been turned on.  Do not shower for the first 24 hours. You may shower after the first 24 hours.  Press the button if you feel a symptom. You will hear a small click. Record Date, Time and  Symptom in the Patient Logbook.  When you are ready to remove the patch, follow instructions on the last 2 pages of Patient  Logbook. Stick patch monitor onto the last page of Patient Logbook.  Place Patient Logbook in the blue and white box. Use locking tab on box and tape box closed  securely. The blue and white box has prepaid postage on it. Please place it in the mailbox as  soon as possible. Your physician should have your test results approximately 7 days after the  monitor has been mailed back to [facility].  Call [place] at [phone] if you have questions regarding  your ZIO XT patch monitor. Call them immediately if you see an orange light blinking on your  monitor.  If your monitor falls off in less than 4 days, contact our Monitor department at [phone].  If your monitor becomes loose or falls off after 4 days call [employer] at [phone] for  suggestions on securing your monitor

## 2022-09-01 DIAGNOSIS — R002 Palpitations: Secondary | ICD-10-CM

## 2022-09-25 DIAGNOSIS — R002 Palpitations: Secondary | ICD-10-CM | POA: Diagnosis not present

## 2023-01-02 ENCOUNTER — Encounter: Payer: Self-pay | Admitting: Internal Medicine

## 2023-01-02 ENCOUNTER — Ambulatory Visit (INDEPENDENT_AMBULATORY_CARE_PROVIDER_SITE_OTHER): Payer: Medicare HMO | Admitting: Internal Medicine

## 2023-01-02 VITALS — BP 140/86 | HR 89 | Temp 97.9°F | Ht 62.5 in | Wt 122.0 lb

## 2023-01-02 DIAGNOSIS — R5383 Other fatigue: Secondary | ICD-10-CM | POA: Diagnosis not present

## 2023-01-02 DIAGNOSIS — M5441 Lumbago with sciatica, right side: Secondary | ICD-10-CM

## 2023-01-02 DIAGNOSIS — Z23 Encounter for immunization: Secondary | ICD-10-CM

## 2023-01-02 DIAGNOSIS — R011 Cardiac murmur, unspecified: Secondary | ICD-10-CM | POA: Diagnosis not present

## 2023-01-02 DIAGNOSIS — G8929 Other chronic pain: Secondary | ICD-10-CM

## 2023-01-02 DIAGNOSIS — E041 Nontoxic single thyroid nodule: Secondary | ICD-10-CM

## 2023-01-02 DIAGNOSIS — Z0001 Encounter for general adult medical examination with abnormal findings: Secondary | ICD-10-CM

## 2023-01-02 DIAGNOSIS — Z8669 Personal history of other diseases of the nervous system and sense organs: Secondary | ICD-10-CM | POA: Diagnosis not present

## 2023-01-02 LAB — COMPREHENSIVE METABOLIC PANEL
ALT: 18 U/L (ref 0–35)
AST: 23 U/L (ref 0–37)
Albumin: 4.3 g/dL (ref 3.5–5.2)
Alkaline Phosphatase: 66 U/L (ref 39–117)
BUN: 22 mg/dL (ref 6–23)
CO2: 30 mEq/L (ref 19–32)
Calcium: 10.1 mg/dL (ref 8.4–10.5)
Chloride: 102 mEq/L (ref 96–112)
Creatinine, Ser: 0.89 mg/dL (ref 0.40–1.20)
GFR: 57.81 mL/min — ABNORMAL LOW (ref >60.00)
Glucose, Bld: 67 mg/dL — ABNORMAL LOW (ref 70–99)
Potassium: 4.6 mEq/L (ref 3.5–5.1)
Sodium: 138 mEq/L (ref 135–145)
Total Bilirubin: 0.5 mg/dL (ref 0.2–1.2)
Total Protein: 7.7 g/dL (ref 6.0–8.3)

## 2023-01-02 LAB — VITAMIN D 25 HYDROXY (VIT D DEFICIENCY, FRACTURES): VITD: 39.87 ng/mL (ref 30.00–100.00)

## 2023-01-02 LAB — CBC
HCT: 39.4 % (ref 36.0–46.0)
Hemoglobin: 12.8 g/dL (ref 12.0–15.0)
MCHC: 32.5 g/dL (ref 30.0–36.0)
MCV: 90.9 fl (ref 78.0–100.0)
Platelets: 320 10*3/uL (ref 150.0–400.0)
RBC: 4.34 Mil/uL (ref 3.87–5.11)
RDW: 14.6 % (ref 11.5–15.5)
WBC: 8.3 10*3/uL (ref 4.0–10.5)

## 2023-01-02 LAB — LIPID PANEL
Cholesterol: 222 mg/dL — ABNORMAL HIGH (ref 0–200)
HDL: 71.2 mg/dL (ref >39.00)
LDL Cholesterol: 132 mg/dL — ABNORMAL HIGH (ref 0–99)
NonHDL: 150.92
Total CHOL/HDL Ratio: 3
Triglycerides: 94 mg/dL (ref 0.0–149.0)
VLDL: 18.8 mg/dL (ref 0.0–40.0)

## 2023-01-02 LAB — VITAMIN B12: Vitamin B-12: 307 pg/mL (ref 211–911)

## 2023-01-02 LAB — TSH: TSH: 7.62 u[IU]/mL — ABNORMAL HIGH (ref 0.35–5.50)

## 2023-01-02 NOTE — Progress Notes (Unsigned)
Subjective:   Patient ID: Stephanie Carney, female    DOB: Sep 23, 1932, 89 y.o.   MRN: [id number]  Leg Pain    Here for medicare wellness and physical, with new complaints. Please see A/P for status and treatment of chronic medical problems. Last seen 2021  Diet: heart healthy Physical activity: sedentary Depression/mood screen: negative Hearing: intact to whispered voice Visual acuity: grossly normal, has glaucoma, performs annual eye exam  ADLs: capable Fall risk: none Home safety: good Cognitive evaluation: intact to orientation, naming, recall and repetition EOL planning: adv directives discussed, has but needs to update due to people on HCPOA deceased  [place] Visit from 01/02/2023 in [place] [facility] [place] Visit from 01/02/2023 in [place] at [facility]  PHQ-9 Total Score 0         02/06/2018    9:05 AM 11/03/2019    3:56 PM 03/26/2020    7:24 PM 03/30/2020    2:01 PM 01/02/2023    2:20 PM  [place] in the past year? No 0  1 0  Was there an injury with Fall?    1 0  Fall Risk Category Calculator     0  (RETIRED) Patient Fall Risk Level  Low fall risk Low fall risk Low fall risk   Fall risk Follow up     Falls evaluation completed    I have personally reviewed and have noted 1. The patient's medical and social history - reviewed today no changes 2. Their use of alcohol, tobacco or illicit drugs 3. Their current medications and supplements 4. The patient's functional ability including ADL's, fall risks, home safety risks and hearing or visual impairment. 5. Diet and physical activities 6. Evidence for depression or mood disorders 7. Care team reviewed and updated 8.  The patient is not on an opioid pain medication.  Patient Care Team: Hoyt Koch, MD as PCP - General (Internal Medicine) Sanda Klein, MD as PCP - Cardiology  (Cardiology) Past Medical History:  Diagnosis Date   Acid reflux 02/10/2011   Arthritis    Asthma    Breast cancer ([place]) 02/10/2011   left breast   Bruises easily 02/10/2011   Cancer ([place])    Cervical cancer ([facility]) 02/10/2011   Diabetes mellitus    Fibromyalgia 02/10/2011   Glaucoma    Goiter 2014   Joint pain 02/10/2011   Night sweats 02/10/2011   Osteoporosis 04/10/2018   Shingles    Weight increase 02/10/2011   Past Surgical History:  Procedure Laterality Date   BREAST LUMPECTOMY  02/2011   left   CERVICAL CONE BIOPSY     LEG SURGERY  may 2011   SPINE SURGERY     UTERINE SUSPENSION     Growth/mass removal   Family History  Problem Relation Age of Onset   Cancer Father        enviromental exposure   Cancer Brother        enviromental exposure   Review of Systems  Constitutional: Negative.   HENT: Negative.    Eyes: Negative.   Respiratory:  Negative for cough, chest tightness and shortness of breath.   Cardiovascular:  Negative for chest pain, palpitations and leg swelling.  Gastrointestinal:  Negative for abdominal distention, abdominal pain, constipation, diarrhea, nausea and vomiting.  Musculoskeletal: Negative.   Skin: Negative.   Neurological: Negative.  Psychiatric/Behavioral: Negative.      Objective:  Physical Exam Constitutional:      Appearance: She is well-developed.  HENT:     Head: Normocephalic and atraumatic.  Cardiovascular:     Rate and Rhythm: Normal rate and regular rhythm.  Pulmonary:     Effort: Pulmonary effort is normal. No respiratory distress.     Breath sounds: Normal breath sounds. No wheezing or rales.  Abdominal:     General: Bowel sounds are normal. There is no distension.     Palpations: Abdomen is soft.     Tenderness: There is no abdominal tenderness. There is no rebound.  Musculoskeletal:        General: Tenderness present.     Cervical back: Normal range of motion.  Skin:    General: Skin is warm and dry.  Neurological:      Mental Status: She is alert and oriented to person, place, and time.     Coordination: Coordination normal.     Comments: Cane to walk     Vitals:   01/02/23 1421 01/02/23 1423  BP: (!) 140/68 (!) 140/86  Pulse: 89   Temp: 97.9 F (36.6 C)   TempSrc: Oral   SpO2: 99%   Weight: 122 lb (55.3 kg)   Height: 5' 2.5" (1.588 m)     Assessment & Plan:  Prevnar 20 given at visit

## 2023-01-02 NOTE — Assessment & Plan Note (Signed)
Flu shot hx GBS does not qualify. Covid-19 counseled. Pneumonia given 20 today. Shingrix counseled due at pharmacy. Tetanus due at pharmacy. Colonoscopy aged out. Mammogram aged out, pap smear aged out and dexa complete. Counseled about sun safety and mole surveillance. Counseled about the dangers of distracted driving. Given 10 year screening recommendations.

## 2023-01-04 ENCOUNTER — Encounter: Payer: Self-pay | Admitting: Internal Medicine

## 2023-01-04 DIAGNOSIS — R5383 Other fatigue: Secondary | ICD-10-CM | POA: Insufficient documentation

## 2023-01-04 DIAGNOSIS — G8929 Other chronic pain: Secondary | ICD-10-CM | POA: Insufficient documentation

## 2023-01-04 NOTE — Assessment & Plan Note (Signed)
New concern today although this has been chronic for several months. She has previously seen neurosurgery with good results and wishes to return. Referral placed and checking CBC to exclude alternate cause. She wishes to defer x-ray and do that with neurosurgery.

## 2023-01-04 NOTE — Assessment & Plan Note (Signed)
Needs updated lipid panel to assess risk ordered today.

## 2023-01-04 NOTE — Assessment & Plan Note (Signed)
New to me although chronic for some months to years. Checking TSH, B12, vitamin D, CBC, CMP as no labs in some time to screen for metabolic etiology.

## 2023-01-04 NOTE — Assessment & Plan Note (Signed)
No recurrence, does not get flu vaccine.

## 2023-01-04 NOTE — Assessment & Plan Note (Signed)
Checking TSH and adjust as needed. No check in some time. No new dysphagia symptoms.

## 2023-01-08 ENCOUNTER — Other Ambulatory Visit: Payer: Self-pay | Admitting: Internal Medicine

## 2023-01-08 DIAGNOSIS — R7989 Other specified abnormal findings of blood chemistry: Secondary | ICD-10-CM

## 2023-01-09 ENCOUNTER — Telehealth: Payer: Self-pay | Admitting: Internal Medicine

## 2023-01-09 NOTE — Telephone Encounter (Signed)
Spoke with patient and informed her that she can come back to lab at anytime to redo her labs. Patient stated that it may not be until another week until she is able to do so.

## 2023-01-09 NOTE — Telephone Encounter (Signed)
Pt called back to returning nurse call pt states she is available before noon and would be out the remaining of the day.

## 2023-01-11 DIAGNOSIS — M4727 Other spondylosis with radiculopathy, lumbosacral region: Secondary | ICD-10-CM | POA: Diagnosis not present

## 2023-01-15 ENCOUNTER — Other Ambulatory Visit: Payer: Self-pay | Admitting: Neurosurgery

## 2023-01-15 DIAGNOSIS — M4727 Other spondylosis with radiculopathy, lumbosacral region: Secondary | ICD-10-CM

## 2023-01-17 ENCOUNTER — Other Ambulatory Visit (INDEPENDENT_AMBULATORY_CARE_PROVIDER_SITE_OTHER): Payer: Medicare HMO

## 2023-01-17 DIAGNOSIS — R7989 Other specified abnormal findings of blood chemistry: Secondary | ICD-10-CM

## 2023-01-17 LAB — T4, FREE: Free T4: 0.78 ng/dL (ref 0.60–1.60)

## 2023-02-13 ENCOUNTER — Ambulatory Visit
Admission: RE | Admit: 2023-02-13 | Discharge: 2023-02-13 | Disposition: A | Discharge status: Home / Self Care | Payer: Medicare HMO | Source: Ambulatory Visit | Attending: Neurosurgery | Admitting: Neurosurgery

## 2023-02-13 DIAGNOSIS — M545 Low back pain, unspecified: Secondary | ICD-10-CM | POA: Diagnosis not present

## 2023-02-13 DIAGNOSIS — M4727 Other spondylosis with radiculopathy, lumbosacral region: Secondary | ICD-10-CM

## 2023-02-13 DIAGNOSIS — M48061 Spinal stenosis, lumbar region without neurogenic claudication: Secondary | ICD-10-CM | POA: Diagnosis not present

## 2023-02-13 DIAGNOSIS — M79604 Pain in right leg: Secondary | ICD-10-CM | POA: Diagnosis not present

## 2023-02-26 DIAGNOSIS — H401134 Primary open-angle glaucoma, bilateral, indeterminate stage: Secondary | ICD-10-CM | POA: Diagnosis not present

## 2023-03-09 DIAGNOSIS — N390 Urinary tract infection, site not specified: Secondary | ICD-10-CM | POA: Diagnosis not present

## 2023-03-14 DIAGNOSIS — M4727 Other spondylosis with radiculopathy, lumbosacral region: Secondary | ICD-10-CM | POA: Diagnosis not present

## 2023-03-14 DIAGNOSIS — G8929 Other chronic pain: Secondary | ICD-10-CM | POA: Diagnosis not present

## 2023-03-14 DIAGNOSIS — M25561 Pain in right knee: Secondary | ICD-10-CM | POA: Diagnosis not present

## 2023-03-14 DIAGNOSIS — M47816 Spondylosis without myelopathy or radiculopathy, lumbar region: Secondary | ICD-10-CM | POA: Diagnosis not present

## 2023-04-26 DIAGNOSIS — M1711 Unilateral primary osteoarthritis, right knee: Secondary | ICD-10-CM | POA: Diagnosis not present

## 2023-04-26 DIAGNOSIS — M25551 Pain in right hip: Secondary | ICD-10-CM | POA: Diagnosis not present

## 2023-04-26 DIAGNOSIS — M25571 Pain in right ankle and joints of right foot: Secondary | ICD-10-CM | POA: Diagnosis not present

## 2023-06-19 DIAGNOSIS — M5441 Lumbago with sciatica, right side: Secondary | ICD-10-CM | POA: Diagnosis not present

## 2023-06-19 DIAGNOSIS — M5442 Lumbago with sciatica, left side: Secondary | ICD-10-CM | POA: Diagnosis not present

## 2023-06-19 DIAGNOSIS — M25552 Pain in left hip: Secondary | ICD-10-CM | POA: Diagnosis not present

## 2023-12-31 DIAGNOSIS — H4053X3 Glaucoma secondary to other eye disorders, bilateral, severe stage: Secondary | ICD-10-CM | POA: Diagnosis not present

## 2023-12-31 DIAGNOSIS — H548 Legal blindness, as defined in USA: Secondary | ICD-10-CM | POA: Diagnosis not present

## 2024-02-15 ENCOUNTER — Ambulatory Visit: Payer: Medicare HMO

## 2024-02-15 VITALS — Ht 62.5 in | Wt 122.0 lb

## 2024-02-15 DIAGNOSIS — Z0000 Encounter for general adult medical examination without abnormal findings: Secondary | ICD-10-CM | POA: Diagnosis not present

## 2024-02-15 NOTE — Patient Instructions (Addendum)
 Ms. Flinchum , Thank you for taking time to come for your Medicare Wellness Visit. I appreciate your ongoing commitment to your health goals. Please review the following plan we discussed and let me know if I can assist you in the future.   Referrals/Orders/Follow-Ups/Clinician Recommendations: Aim for 30 minutes of exercise or brisk walking, 6-8 glasses of water, and 5 servings of fruits and vegetables each day.   This is a list of the screening recommended for you and due dates:  Health Maintenance  Topic Date Due   DTaP/Tdap/Td vaccine (1 - Tdap) Never done   Zoster (Shingles) Vaccine (1 of 2) Never done   COVID-19 Vaccine (4 - 2024-25 season) 07/22/2023   Medicare Annual Wellness Visit  02/14/2025   Pneumonia Vaccine  Completed   DEXA scan (bone density measurement)  Completed   HPV Vaccine  Aged Out    Advanced directives: (Copy Requested) Please bring a copy of your health care power of attorney and living will to the office to be added to your chart at your convenience. You can mail to [facility] [address] [place] [address], [place] [address] or email to [email or web address]  Next Medicare Annual Wellness Visit scheduled for next year: Yes

## 2024-02-15 NOTE — Progress Notes (Signed)
 Subjective:  Please attest and cosign this visit due to patients primary care provider not being in the office at the time the visit was completed.  (Pt of Dr Hillard Danker)   Stephanie Carney is a 89 y.o. who presents for a Medicare Wellness preventive visit.  Visit Complete: Virtual I connected with  Molli Barrows on 02/15/24 by a audio enabled telemedicine application and verified that I am speaking with the correct person using two identifiers.  Patient Location: Home  Provider Location: Office/Clinic  I discussed the limitations of evaluation and management by telemedicine. The patient expressed understanding and agreed to proceed.  Vital Signs: Because this visit was a virtual/telehealth visit, some criteria may be missing or patient reported. Any vitals not documented were not able to be obtained and vitals that have been documented are patient reported.  VideoDeclined- This patient declined Librarian, academic. Therefore the visit was completed with audio only.  Persons Participating in Visit: Patient.  AWV Questionnaire: No: Patient Medicare AWV questionnaire was not completed prior to this visit.  Cardiac Risk Factors include: advanced age (>37men, >21 women)     Objective:    Today's Vitals   02/15/24 1123  Weight: 122 lb (55.3 kg)  Height: 5' 2.5" (1.588 m)   Body mass index is 21.96 kg/m.     02/15/2024   11:21 AM 03/26/2020    7:23 PM 03/12/2017    3:22 PM 08/03/2016    1:05 PM 07/14/2015    1:46 PM  Advanced Directives  Does Patient Have a Medical Advance Directive? Yes No No No Yes  Type of Estate agent of [place];Living will      Copy of Healthcare Power of Attorney in Chart? No - copy requested      Would patient like information on creating a medical advance directive?   No - Patient declined No - patient declined information     Current Medications (verified) Outpatient Encounter Medications  as of 02/15/2024  Medication Sig   acetaminophen (TYLENOL) 325 MG tablet Take 325 mg by mouth as needed.   aspirin 81 MG tablet Take 81 mg by mouth daily.     betaxolol (BETOPTIC-S) 0.25 % ophthalmic suspension Place 1 drop into both eyes 2 (two) times daily.   brinzolamide (AZOPT) 1 % ophthalmic suspension Place 1 drop into both eyes 3 (three) times daily.   COD LIVER OIL PO Take by mouth.   MULTIPLE VITAMIN PO Take 1 tablet by mouth daily.   Multiple Vitamins-Minerals (EYE VITAMINS) CAPS Take by mouth.   ROCKLATAN 0.02-0.005 % SOLN Place 1 drop into the right eye at bedtime.   Vitamin Mixture (ESTER-C PO) Take by mouth.   XALATAN 0.005 % ophthalmic solution Place 1 drop into both eyes daily.   [DISCONTINUED] metoprolol succinate (TOPROL XL) 25 MG 24 hr tablet Take 1 tablet (25 mg total) by mouth daily.   No facility-administered encounter medications on file as of 02/15/2024.    Allergies (verified) Latanoprost, Bactrim, Doxycycline, Tetracyclines & related, Ciprofloxacin, Macrobid [nitrofurantoin macrocrystal], and Latex   History: Past Medical History:  Diagnosis Date   Acid reflux 02/10/2011   Arthritis    Asthma    Breast cancer ([facility]) 02/10/2011   left breast   Bruises easily 02/10/2011   Cancer ([facility])    Cervical cancer ([facility]) 02/10/2011   Diabetes mellitus    Fibromyalgia 02/10/2011   Glaucoma    Goiter 2014   Joint pain 02/10/2011  Night sweats 02/10/2011   Osteoporosis 04/10/2018   Shingles    Weight increase 02/10/2011   Past Surgical History:  Procedure Laterality Date   BREAST LUMPECTOMY  02/2011   left   CERVICAL CONE BIOPSY     LEG SURGERY  may 2011   SPINE SURGERY     UTERINE SUSPENSION     Growth/mass removal   Family History  Problem Relation Age of Onset   Cancer Father        enviromental exposure   Cancer Brother        enviromental exposure   Social History   Socioeconomic History   Marital status: Divorced    Spouse name: Not on file   Number  of children: Not on file   Years of education: Not on file   Highest education level: Not on file  Occupational History   Not on file  Tobacco Use   Smoking status: Never    Passive exposure: Never   Smokeless tobacco: Never  Vaping Use   Vaping status: Not on file  Substance and Sexual Activity   Alcohol use: No   Drug use: No   Sexual activity: Yes    Birth control/protection: Post-menopausal  Other Topics Concern   Not on file  Social History Narrative   Not on file   Social Drivers of Health   Financial Resource Strain: Low Risk  (02/15/2024)   Overall Financial Resource Strain (CARDIA)    Difficulty of Paying Living Expenses: Not hard at all  Food Insecurity: No Food Insecurity (02/15/2024)   Hunger Vital Sign    Worried About Running Out of Food in the Last Year: Never true    Ran Out of Food in the Last Year: Never true  Transportation Needs: No Transportation Needs (02/15/2024)   PRAPARE - Administrator, Civil Service (Medical): No    Lack of Transportation (Non-Medical): No  Physical Activity: Insufficiently Active (02/15/2024)   Exercise Vital Sign    Days of Exercise per Week: 3 days    Minutes of Exercise per Session: 30 min  Stress: No Stress Concern Present (02/15/2024)   [employer] of Occupational Health - Occupational Stress Questionnaire    Feeling of Stress : Not at all  Social Connections: Socially Isolated (02/15/2024)   Social Connection and Isolation Panel [NHANES]    Frequency of Communication with Friends and Family: More than three times a week    Frequency of Social Gatherings with Friends and Family: Never    Attends Religious Services: Never    Database administrator or Organizations: No    Attends Engineer, structural: Never    Marital Status: Divorced    Tobacco Counseling Counseling given: Not Answered    Clinical Intake:  Pre-visit preparation completed: Yes  Pain : No/denies pain     BMI - recorded:  21.96 Nutritional Status: BMI of 19-24  Normal Nutritional Risks: None Diabetes: No  No results found for: "HGBA1C"   How often do you need to have someone help you when you read instructions, pamphlets, or other written materials from your doctor or pharmacy?: 1 - Never  Interpreter Needed?: No  Information entered by :: Hassell Halim, CMA   Activities of Daily Living     02/15/2024   11:31 AM  In your present state of health, do you have any difficulty performing the following activities:  Hearing? 0  Vision? 0  Difficulty concentrating or making decisions? 0  Walking  or climbing stairs? 0  Dressing or bathing? 0  Doing errands, shopping? 0  Preparing Food and eating ? N  Using the Toilet? N  In the past six months, have you accidently leaked urine? Y  Comment wears a pantyliner - no Urology referral needed per pt  Do you have problems with loss of bowel control? N  Managing your Medications? N  Managing your Finances? N  Housekeeping or managing your Housekeeping? N    Patient Care Team: Myrlene Broker, MD as PCP - General (Internal Medicine) Croitoru, Rachelle Hora, MD as PCP - Cardiology (Cardiology)  Indicate any recent Medical Services you may have received from other than [facility] providers in the past year (date may be approximate).     Assessment:   This is a routine wellness examination for [place].  Hearing/Vision screen Hearing Screening - Comments:: Denies hearing difficulties   Vision Screening - Comments:: Denies vision difficulties   Goals Addressed               This Visit's Progress     Patient Stated (pt-stated)        Patient stated that she needs to exercise more.       Depression Screen     02/15/2024   11:27 AM 01/02/2023    2:20 PM 11/03/2019    3:56 PM 02/06/2018    9:05 AM  PHQ 2/9 Scores  PHQ - 2 Score 0 0 0 0  PHQ- 9 Score 2 0      Fall Risk     02/15/2024   11:25 AM 01/02/2023    2:20 PM 03/30/2020    2:01 PM 11/03/2019     3:56 PM 02/06/2018    9:05 AM  Fall Risk   Falls in the past year? 0 0 1 0 No  Number falls in past yr: 0 0     Injury with Fall? 0 0 1    Risk for fall due to : No Fall Risks      Follow up Falls prevention discussed;Falls evaluation completed Falls evaluation completed       MEDICARE RISK AT HOME:  Medicare Risk at Home Any stairs in or around the home?: No If so, are there any without handrails?: No Home free of loose throw rugs in walkways, pet beds, electrical cords, etc?: Yes Adequate lighting in your home to reduce risk of falls?: Yes Life alert?: No Use of a cane, walker or w/c?: No Grab bars in the bathroom?: Yes Shower chair or bench in shower?: No Elevated toilet seat or a handicapped toilet?: No  TIMED UP AND GO:  Was the test performed?  No  Cognitive Function: 6CIT completed        02/15/2024   11:26 AM  6CIT Screen  What Year? 0 points  What month? 0 points  What time? 0 points  Count back from 20 0 points  Months in reverse 0 points  Repeat phrase 0 points  Total Score 0 points    Immunizations Immunization History  Administered Date(s) Administered   PFIZER(Purple Top)SARS-COV-2 Vaccination 01/13/2020, 02/03/2020, 09/30/2020   PNEUMOCOCCAL CONJUGATE-20 01/02/2023   Pneumococcal Conjugate-13 07/30/2018    Screening Tests Health Maintenance  Topic Date Due   DTaP/Tdap/Td (1 - Tdap) Never done   Zoster Vaccines- Shingrix (1 of 2) Never done   COVID-19 Vaccine (4 - 2024-25 season) 07/22/2023   Medicare Annual Wellness (AWV)  02/14/2025   Pneumonia Vaccine 28+ Years old  Completed  DEXA SCAN  Completed   HPV VACCINES  Aged Out    Health Maintenance  Health Maintenance Due  Topic Date Due   DTaP/Tdap/Td (1 - Tdap) Never done   Zoster Vaccines- Shingrix (1 of 2) Never done   COVID-19 Vaccine (4 - 2024-25 season) 07/22/2023   Health Maintenance Items Addressed: 02/15/2024   Additional Screening:  Vision Screening: Recommended  annual ophthalmology exams for early detection of glaucoma and other disorders of the eye.  Dental Screening: Recommended annual dental exams for proper oral hygiene  Community Resource Referral / Chronic Care Management: CRR required this visit?  No   CCM required this visit?  No     Plan:     I have personally reviewed and noted the following in the patient's chart:   Medical and social history Use of alcohol, tobacco or illicit drugs  Current medications and supplements including opioid prescriptions. Patient is not currently taking opioid prescriptions. Functional ability and status Nutritional status Physical activity Advanced directives List of other physicians Hospitalizations, surgeries, and ER visits in previous 12 months Vitals Screenings to include cognitive, depression, and falls Referrals and appointments  In addition, I have reviewed and discussed with patient certain preventive protocols, quality metrics, and best practice recommendations. A written personalized care plan for preventive services as well as general preventive health recommendations were provided to patient.    Darreld Mclean, CMA   02/15/2024   After Visit Summary: (Mail) Due to this being a telephonic visit, the after visit summary with patients personalized plan was offered to patient via mail   Notes: Nothing significant to report at this time.

## 2024-03-04 ENCOUNTER — Encounter: Admitting: Internal Medicine

## 2024-03-14 ENCOUNTER — Ambulatory Visit: Admitting: Internal Medicine

## 2024-03-14 ENCOUNTER — Encounter: Payer: Self-pay | Admitting: Internal Medicine

## 2024-03-14 VITALS — BP 128/80 | HR 94 | Temp 97.7°F | Ht 62.5 in | Wt 120.0 lb

## 2024-03-14 DIAGNOSIS — E041 Nontoxic single thyroid nodule: Secondary | ICD-10-CM | POA: Diagnosis not present

## 2024-03-14 DIAGNOSIS — Z0001 Encounter for general adult medical examination with abnormal findings: Secondary | ICD-10-CM

## 2024-03-14 DIAGNOSIS — Z8669 Personal history of other diseases of the nervous system and sense organs: Secondary | ICD-10-CM | POA: Diagnosis not present

## 2024-03-14 DIAGNOSIS — Z0000 Encounter for general adult medical examination without abnormal findings: Secondary | ICD-10-CM

## 2024-03-14 DIAGNOSIS — M810 Age-related osteoporosis without current pathological fracture: Secondary | ICD-10-CM | POA: Diagnosis not present

## 2024-03-14 LAB — LIPID PANEL
Cholesterol: 192 mg/dL (ref 0–200)
HDL: 70.1 mg/dL (ref >39.00)
LDL Cholesterol: 100 mg/dL — ABNORMAL HIGH (ref 0–99)
NonHDL: 122.14
Total CHOL/HDL Ratio: 3
Triglycerides: 113 mg/dL (ref 0.0–149.0)
VLDL: 22.6 mg/dL (ref 0.0–40.0)

## 2024-03-14 LAB — COMPREHENSIVE METABOLIC PANEL WITH GFR
ALT: 18 U/L (ref 0–35)
AST: 24 U/L (ref 0–37)
Albumin: 4.3 g/dL (ref 3.5–5.2)
Alkaline Phosphatase: 59 U/L (ref 39–117)
BUN: 22 mg/dL (ref 6–23)
CO2: 31 meq/L (ref 19–32)
Calcium: 9.9 mg/dL (ref 8.4–10.5)
Chloride: 103 meq/L (ref 96–112)
Creatinine, Ser: 0.83 mg/dL (ref 0.40–1.20)
GFR: 62.34 mL/min (ref >60.00)
Glucose, Bld: 78 mg/dL (ref 70–99)
Potassium: 4.6 meq/L (ref 3.5–5.1)
Sodium: 139 meq/L (ref 135–145)
Total Bilirubin: 0.6 mg/dL (ref 0.2–1.2)
Total Protein: 7.5 g/dL (ref 6.0–8.3)

## 2024-03-14 LAB — CBC
HCT: 40.6 % (ref 36.0–46.0)
Hemoglobin: 13.3 g/dL (ref 12.0–15.0)
MCHC: 32.8 g/dL (ref 30.0–36.0)
MCV: 92.3 fl (ref 78.0–100.0)
Platelets: 284 10*3/uL (ref 150.0–400.0)
RBC: 4.4 Mil/uL (ref 3.87–5.11)
RDW: 14.7 % (ref 11.5–15.5)
WBC: 7.8 10*3/uL (ref 4.0–10.5)

## 2024-03-14 LAB — TSH: TSH: 10.12 u[IU]/mL — ABNORMAL HIGH (ref 0.35–5.50)

## 2024-03-14 LAB — T4, FREE: Free T4: 0.74 ng/dL (ref 0.60–1.60)

## 2024-03-14 NOTE — Assessment & Plan Note (Signed)
 Checking CMP.

## 2024-03-14 NOTE — Assessment & Plan Note (Signed)
 Flu shot cannot get. Pneumonia complete. Shingrix counseled. Tetanus counseled. Colonoscopy aged out. Mammogram aged out, pap smear aged out and dexa complete. Counseled about sun safety and mole surveillance. Counseled about the dangers of distracted driving. Given 10 year screening recommendations.

## 2024-03-14 NOTE — Assessment & Plan Note (Signed)
 No flare does not get flu shot.

## 2024-03-14 NOTE — Progress Notes (Signed)
   Subjective:   Patient ID: Stephanie Carney, female    DOB: May 24, 1932, 89 y.o.   MRN: [id number]  HPI The patient is here for physical.  PMH, [facility], social history reviewed and updated  Review of Systems  Constitutional: Negative.   HENT: Negative.    Eyes: Negative.   Respiratory:  Negative for cough, chest tightness and shortness of breath.   Cardiovascular:  Negative for chest pain, palpitations and leg swelling.  Gastrointestinal:  Negative for abdominal distention, abdominal pain, constipation, diarrhea, nausea and vomiting.  Musculoskeletal: Negative.   Skin: Negative.   Neurological: Negative.   Psychiatric/Behavioral: Negative.      Objective:  Physical Exam Constitutional:      Appearance: She is well-developed.  HENT:     Head: Normocephalic and atraumatic.  Cardiovascular:     Rate and Rhythm: Normal rate and regular rhythm.  Pulmonary:     Effort: Pulmonary effort is normal. No respiratory distress.     Breath sounds: Normal breath sounds. No wheezing or rales.  Abdominal:     General: Bowel sounds are normal. There is no distension.     Palpations: Abdomen is soft.     Tenderness: There is no abdominal tenderness. There is no rebound.  Musculoskeletal:     Cervical back: Normal range of motion.  Skin:    General: Skin is warm and dry.  Neurological:     Mental Status: She is alert and oriented to person, place, and time.     Coordination: Coordination normal.     Vitals:   03/14/24 1154  BP: 128/80  Pulse: 94  Temp: 97.7 F (36.5 C)  TempSrc: Oral  SpO2: 99%  Weight: 120 lb (54.4 kg)  Height: 5' 2.5" (1.588 m)    Assessment & Plan:

## 2024-03-14 NOTE — Assessment & Plan Note (Signed)
Checking TSH and free T4.  

## 2024-08-29 DIAGNOSIS — H903 Sensorineural hearing loss, bilateral: Secondary | ICD-10-CM | POA: Diagnosis not present

## 2024-09-08 DIAGNOSIS — H4053X3 Glaucoma secondary to other eye disorders, bilateral, severe stage: Secondary | ICD-10-CM | POA: Diagnosis not present

## 2024-09-08 DIAGNOSIS — H548 Legal blindness, as defined in USA: Secondary | ICD-10-CM | POA: Diagnosis not present

## 2024-09-15 ENCOUNTER — Telehealth: Payer: Self-pay | Admitting: Cardiovascular Disease

## 2024-09-15 NOTE — Telephone Encounter (Signed)
   Pt c/o of Chest Pain: STAT if active CP, including tightness, pressure, jaw pain, radiating pain to shoulder/upper arm/back, CP unrelieved by Nitro. Symptoms reported of SOB, nausea, vomiting, sweating.  1. Are you having CP right now? Yes. Pain between shoulders and down left arm     2. Are you experiencing any other symptoms (ex. SOB, nausea, vomiting, sweating)? No    3. Is your CP continuous or coming and going? Coming and going    4. Have you taken Nitroglycerin ? No    5. How long have you been experiencing CP? Started this morning     6. If NO CP at time of call then end call with telling Pt to call back or call 911 if Chest pain returns prior to return call from triage team.  Call transferred.

## 2024-09-15 NOTE — Telephone Encounter (Signed)
 Received call transferred from operator and spoke with patient. Patient last seen in October 2023.  She reports new onset of left shoulder pain. Describes as an ache.  Started this morning. Also left arm feels sore and heavy.  Some shortness of breath.  She feels like she needs to be seen.  I advised patient due to new onset of symptoms and ongoing symptoms she should call 911

## 2024-09-24 ENCOUNTER — Ambulatory Visit: Attending: Cardiovascular Disease | Admitting: Cardiovascular Disease

## 2024-09-24 ENCOUNTER — Encounter: Payer: Self-pay | Admitting: Cardiovascular Disease

## 2024-09-24 ENCOUNTER — Ambulatory Visit: Admitting: Cardiovascular Disease

## 2024-09-24 VITALS — BP 142/60 | HR 74 | Ht 62.5 in | Wt 119.8 lb

## 2024-09-24 DIAGNOSIS — E7800 Pure hypercholesterolemia, unspecified: Secondary | ICD-10-CM | POA: Diagnosis not present

## 2024-09-24 DIAGNOSIS — I342 Nonrheumatic mitral (valve) stenosis: Secondary | ICD-10-CM | POA: Diagnosis not present

## 2024-09-24 DIAGNOSIS — I2721 Secondary pulmonary arterial hypertension: Secondary | ICD-10-CM | POA: Diagnosis not present

## 2024-09-24 DIAGNOSIS — R011 Cardiac murmur, unspecified: Secondary | ICD-10-CM | POA: Diagnosis not present

## 2024-09-24 MED ORDER — FUROSEMIDE 20 MG PO TABS: ORAL_TABLET | ORAL | 3 refills | Status: AC

## 2024-09-24 NOTE — Patient Instructions (Signed)
 Medication Instructions:  Start Furosemide 20 mg- Monday, Wednesday, Friday *If you need a refill on your cardiac medications before your next appointment, please call your pharmacy*  Lab Work: None If you have labs (blood work) drawn today and your tests are completely normal, you will receive your results only by: MyChart Message (if you have MyChart) OR A paper copy in the mail If you have any lab test that is abnormal or we need to change your treatment, we will call you to review the results.  Testing/Procedures: Your physician has requested that you have an echocardiogram. Echocardiography is a painless test that uses sound waves to create images of your heart. It provides your doctor with information about the size and shape of your heart and how well your heart's chambers and valves are working. This procedure takes approximately one hour. There are no restrictions for this procedure. Please do NOT wear cologne, perfume, aftershave, or lotions (deodorant is allowed). Please arrive 15 minutes prior to your appointment time.  Please note: We ask at that you not bring children with you during ultrasound (echo/ vascular) testing. Due to room size and safety concerns, children are not allowed in the ultrasound rooms during exams. Our front office staff cannot provide observation of children in our lobby area while testing is being conducted. An adult accompanying a patient to their appointment will only be allowed in the ultrasound room at the discretion of the ultrasound technician under special circumstances. We apologize for any inconvenience.   Follow-Up: At [facility], you and your health needs are our priority.  As part of our continuing mission to provide you with exceptional heart care, our providers are all part of one team.  This team includes your primary Cardiologist (physician) and Advanced Practice Providers or APPs (Physician Assistants and Nurse Practitioners) who all  work together to provide you with the care you need, when you need it.  Your next appointment:   3 month(s)  Provider:   Jerel Balding, MD    We recommend signing up for the patient portal called MyChart.  Sign up information is provided on this After Visit Summary.  MyChart is used to connect with patients for Virtual Visits (Telemedicine).  Patients are able to view lab/test results, encounter notes, upcoming appointments, etc.  Non-urgent messages can be sent to your provider as well.   To learn more about what you can do with MyChart, go to [email or web address].

## 2024-09-24 NOTE — Progress Notes (Signed)
 Cardiology Consultation Note:    Date:  09/24/2024   ID:  Stephanie Carney, DOB [birth year], MRN [id number]  PCP:  Rollene Almarie LABOR, MD  Cardiologist:  Jerel Balding, MD   Referring MD: Rollene Almarie LABOR, *   Chief Complaint  Patient presents with   Cardiac Valve Problem     History of Present Illness:    Stephanie Carney is a 89 y.o. female with mild-moderate mitral stenosis due to severe annular calcification, mild pulmonary artery hypertension, returning in follow-up  She has generally done okay since her last appointment although there has been a slight deterioration in exercise tolerance.  She is notices that it is variable from day-to-day.  Most of the time she has no difficulty walking the 200 feet from her door to the parked car, but sometimes this produces shortness of breath.  Shortness of breath becomes prominent if she is in a hurry but she is also noticed that it is often present if she has recently had a meal.  She has not had dizziness, palpitations or syncope.  She does not have orthopnea, PND. She denies lower extremity edema and has never complained of chest pain.  No focal neurological complaints.  She has noticed that recently her blood pressure has been a little high, but only the systolic blood pressure is high.  We tried to see if her symptoms would improve on beta-blocker therapy, but she did not really notice an improvement in dyspnea and felt very tired.  Stopped it and felt better.  She actually correlates the onset of her shortness of breath with her COVID-19 infection in June 2022.  Since her husband passed away, she lives alone.  She does not have children or any family and most of her friends have either passed away or have moved out of town.  04/05/2022 coronary CT angiogram showed no significant CAD, low calcium  score and 05/04/2022 echocardiogram showed severe mitral annular calcification with secondary mitral stenosis.  The mean mitral gradient  was 15 mmHg at a heart rate of 84 bpm and the calculated mitral valve area was 1.1 cm by the continuity equation although the pressure half-time was not particularly prolonged.  There was also mild pulmonary artery hypertension with a calculated right ventricular systolic pressure of 37 mmHg.  Her only other active medical problem is open-angle glaucoma which is well controlled.  She does not have hypertension, diabetes mellitus, significant dyslipidemia (LDL has been mildly elevated at 125, but excellent HDL in the 70s), history of CAD, PAD or stroke.  She has a chronically abnormal ECG that shows small Q waves in leads I, aVL, II, III and aVF and left axis deviation.  This is unchanged from previous tracings.  Evaluation with echocardiogram in 2019 did not show regional wall motion abnormalities that would support a previous inferoposterior infarction, so the EKG changes may be related to a conduction abnormality (atypical left anterior fascicular block).   Past Medical History:  Diagnosis Date   Acid reflux 02/10/2011   Arthritis    Asthma    Breast cancer (HCC) 02/10/2011   left breast   Bruises easily 02/10/2011   Cancer (HCC)    Cervical cancer (HCC) 02/10/2011   Diabetes mellitus    Fibromyalgia 02/10/2011   Glaucoma    Goiter 2014   Joint pain 02/10/2011   Night sweats 02/10/2011   Osteoporosis 04/10/2018   Shingles    Weight increase 02/10/2011    Past Surgical History:  Procedure Laterality Date  BREAST LUMPECTOMY  02/2011   left   CERVICAL CONE BIOPSY     LEG SURGERY  may 2011   SPINE SURGERY     UTERINE SUSPENSION     Growth/mass removal    Current Medications: Current Meds  Medication Sig   aspirin 81 MG tablet Take 81 mg by mouth daily.     betaxolol (BETOPTIC-S) 0.25 % ophthalmic suspension Place 1 drop into both eyes 2 (two) times daily.   brinzolamide (AZOPT) 1 % ophthalmic suspension Place 1 drop into both eyes 3 (three) times daily.   COD LIVER OIL PO Take by  mouth.   furosemide (LASIX) 20 MG tablet Take 20 mg every Monday, Wednesday, and Friday   MULTIPLE VITAMIN PO Take 1 tablet by mouth daily.   Multiple Vitamins-Minerals (EYE VITAMINS) CAPS Take by mouth.   ROCKLATAN 0.02-0.005 % SOLN Place 1 drop into the right eye at bedtime.   Vitamin Mixture (ESTER-C PO) Take by mouth.     Allergies:   Latanoprost, Bactrim, Doxycycline , Sulfamethoxazole, Tetracyclines & related, Ciprofloxacin , Macrobid [nitrofurantoin macrocrystal], Sulfamethoxazole-trimethoprim, Trimethoprim, and Latex   Social History   Socioeconomic History   Marital status: Divorced    Spouse name: Not on file   Number of children: Not on file   Years of education: Not on file   Highest education level: Not on file  Occupational History   Not on file  Tobacco Use   Smoking status: Never    Passive exposure: Never   Smokeless tobacco: Never  Vaping Use   Vaping status: Not on file  Substance and Sexual Activity   Alcohol use: No   Drug use: No   Sexual activity: Yes    Birth control/protection: Post-menopausal  Other Topics Concern   Not on file  Social History Narrative   Not on file   Social Drivers of Health   Financial Resource Strain: Low Risk  (02/15/2024)   Overall Financial Resource Strain (CARDIA)    Difficulty of Paying Living Expenses: Not hard at all  Food Insecurity: No Food Insecurity (02/15/2024)   Hunger Vital Sign    Worried About Running Out of Food in the Last Year: Never true    Ran Out of Food in the Last Year: Never true  Transportation Needs: No Transportation Needs (02/15/2024)   PRAPARE - Administrator, Civil Service (Medical): No    Lack of Transportation (Non-Medical): No  Physical Activity: Insufficiently Active (02/15/2024)   Exercise Vital Sign    Days of Exercise per Week: 3 days    Minutes of Exercise per Session: 30 min  Stress: No Stress Concern Present (02/15/2024)   [employer] of Occupational Health -  Occupational Stress Questionnaire    Feeling of Stress : Not at all  Social Connections: Socially Isolated (02/15/2024)   Social Connection and Isolation Panel    Frequency of Communication with Friends and Family: More than three times a week    Frequency of Social Gatherings with Friends and Family: Never    Attends Religious Services: Never    Database Administrator or Organizations: No    Attends Engineer, Structural: Never    Marital Status: Divorced     Family History: The patient's family history includes Cancer in her brother and father.  ROS:   Please see the history of present illness.     All other systems reviewed and are negative.  EKGs/Labs/Other Studies Reviewed:    The following studies were  reviewed today:  Coronary CT Angio 04/05/2022 1. Minimal CAD, CADRADS = 1. Mid LAD myocardial bridge with qualitatively mild compression in systole more evident than in diastole.   2. Coronary calcium  score is 81.7, which places the patient in the 35th percentile for sex matched control. Age 61 is out of the validated range for calcium  scoring, comparison made to 44 yo age-, sex-, and race-matched peers.   3. Normal coronary origins with right dominance.   4. Severe mitral annular calcification.   5. Severe left atrial enlargement, moderate right atrial enlargement by visual estimate. Echocardiogram 05/04/2022   1. Global longitudinal strain is -18.3%. Left ventricular ejection  fraction, by estimation, is >75%. The left ventricle has hyperdynamic  function. The left ventricle has no regional wall motion abnormalities.  There is mild left ventricular hypertrophy.  Left ventricular diastolic parameters are consistent with Grade II  diastolic dysfunction (pseudonormalization). Elevated left atrial  pressure.   2. Right ventricular systolic function is normal. The right ventricular  size is normal. There is mildly elevated pulmonary artery systolic  pressure.    3. Left atrial size was moderately dilated.   4. MV is thickened. There is severe annular calcification. Peak and mean  gradients through the valve are 31 and 15 mm HG respectively (HR 84 bpm)  MVA by continuity equation is 1.1 cm2 consistent with severe MS . Mild  mitral valve regurgitation. Severe  mitral annular calcification.   5. The aortic valve is tricuspid. Aortic valve regurgitation is not  visualized. Aortic valve sclerosis/calcification is present, without any  evidence of aortic stenosis.   6. The inferior vena cava is normal in size with greater than 50%  respiratory variability, suggesting right atrial pressure of 3 mmHg.   EKG:    EKG Interpretation Date/Time:  Wednesday September 24 2024 11:48:01 EST Ventricular Rate:  74 PR Interval:  166 QRS Duration:  82 QT Interval:  400 QTC Calculation: 444 R Axis:   -45  Text Interpretation: Normal sinus rhythm Possible Left atrial enlargement Left anterior fascicular block When compared with ECG of 14-Apr-2010 09:59,  axis shifted left Confirmed by Dabney Schanz ([id number]) on 09/24/2024 11:57:43 AM         Recent Labs: 03/14/2024: ALT 18; BUN 22; Creatinine, Ser 0.83; Hemoglobin 13.3; Platelets 284.0; Potassium 4.6; Sodium 139; TSH 10.12  Recent Lipid Panel    Component Value Date/Time   CHOL 192 03/14/2024 1330   TRIG 113.0 03/14/2024 1330   HDL 70.10 03/14/2024 1330   CHOLHDL 3 03/14/2024 1330   VLDL 22.6 03/14/2024 1330   LDLCALC 100 (H) 03/14/2024 1330    Physical Exam:    VS:  BP (!) 142/60   Pulse 74   Ht 5' 2.5 (1.588 m)   Wt 119 lb 12.8 oz (54.3 kg)   SpO2 95%   BMI 21.56 kg/m    Recheck blood pressure 127/80 mmHg Wt Readings from Last 3 Encounters:  09/24/24 119 lb 12.8 oz (54.3 kg)  03/14/24 120 lb (54.4 kg)  02/15/24 122 lb (55.3 kg)      General: Alert, oriented x3, no distress, she is very lean and petite.  She looks younger than her stated age Head: no evidence of trauma, PERRL, EOMI, no  exophtalmos or lid lag, no myxedema, no xanthelasma; normal ears, nose and oropharynx Neck: normal jugular venous pulsations and no hepatojugular reflux; brisk carotid pulses without delay and bilateral carotid bruits Chest: clear to auscultation, no signs of consolidation by percussion or  palpation, normal fremitus, symmetrical and full respiratory excursions Cardiovascular: normal position and quality of the apical impulse, regular rhythm, normal first and second heart sounds, 1-2/6 aortic ejection murmur is heard at the right upper sternal border, no diastolic rumble/murmurs, rubs or gallops Abdomen: no tenderness or distention, no masses by palpation, no abnormal pulsatility or arterial bruits, normal bowel sounds, no hepatosplenomegaly Extremities: no clubbing, cyanosis or edema; 2+ radial, ulnar and brachial pulses bilaterally; 2+ right femoral, posterior tibial and dorsalis pedis pulses; 2+ left femoral, posterior tibial and dorsalis pedis pulses; no subclavian or femoral bruits Neurological: grossly nonfocal Psych: Normal mood and affect   ASSESSMENT:    1. Nonrheumatic mitral valve stenosis   2. Heart murmur   3. Hypercholesterolemia   4. PAH (pulmonary artery hypertension) (HCC)       PLAN:    In order of problems listed above:  Mitral stenosis: This is secondary to annular calcification, worsening compared to 2022.  Attempted treatment with beta-blockers, but this caused more worsening fatigue without causing any improvement in dyspnea.  She does not have overt hypervolemia on physical exam but has exertional dyspnea.  Discussed the fact that this is likely to be a very slowly progressive disorder and that abrupt decompensation is unlikely unless she develops arrhythmia (atrial fibrillation in particular).  I do not think she would be a candidate for mitral valve replacement surgery, but we did discuss about an option for T MVR with deployment of a stent valve and the calcified  annulus.  She appears appropriately reluctant to undergo this procedure due to to her age and I understand that.  At this point her symptoms remain moderate.  Plan to repeat echocardiogram and then will discuss her in the structural heart team meeting.  We have a lot of useful information from her previous coronary CT angiogram.  Will also try to see if a low-dose of diuretic (furosemide 20 mg 3 days weekly) helps her symptomatically.  She is trying to limit her sodium intake, but since she is single, she likes to eat preprepared meals of the TV dinner type.   However, rather than hypervolemia she primarily has symptoms of limited cardiac output, so diuretics may not help. Carotid bruits: Prominent, but not associated with meaningful carotid obstruction based on previous duplex ultrasound. HLP: LDL cholesterol has improved since her last appointment in is only 100 and she has an excellent HDL.  Her calcium  score showed plaque that is less than average for her age (35th percentile).  I do not think she needs lipid-lowering therapy. Abnormal ECG: ECG suggest previous infarction, but the previous echo showed normal wall motion and overall excellent LVEF.  Suspect the abnormalities are due to a conduction defect.     Medication Adjustments/Labs and Tests Ordered: Current medicines are reviewed at length with the patient today.  Concerns regarding medicines are outlined above.  Orders Placed This Encounter  Procedures   EKG 12-Lead   ECHOCARDIOGRAM COMPLETE   Meds ordered this encounter  Medications   furosemide (LASIX) 20 MG tablet    Sig: Take 20 mg every Monday, Wednesday, and Friday    Dispense:  45 tablet    Refill:  3    Patient Instructions  Medication Instructions:  Start Furosemide 20 mg- Monday, Wednesday, Friday *If you need a refill on your cardiac medications before your next appointment, please call your pharmacy*  Lab Work: None If you have labs (blood work) drawn today and  your tests are completely normal,  you will receive your results only by: MyChart Message (if you have MyChart) OR A paper copy in the mail If you have any lab test that is abnormal or we need to change your treatment, we will call you to review the results.  Testing/Procedures: Your physician has requested that you have an echocardiogram. Echocardiography is a painless test that uses sound waves to create images of your heart. It provides your doctor with information about the size and shape of your heart and how well your heart's chambers and valves are working. This procedure takes approximately one hour. There are no restrictions for this procedure. Please do NOT wear cologne, perfume, aftershave, or lotions (deodorant is allowed). Please arrive 15 minutes prior to your appointment time.  Please note: We ask at that you not bring children with you during ultrasound (echo/ vascular) testing. Due to room size and safety concerns, children are not allowed in the ultrasound rooms during exams. Our front office staff cannot provide observation of children in our lobby area while testing is being conducted. An adult accompanying a patient to their appointment will only be allowed in the ultrasound room at the discretion of the ultrasound technician under special circumstances. We apologize for any inconvenience.   Follow-Up: At [facility], you and your health needs are our priority.  As part of our continuing mission to provide you with exceptional heart care, our providers are all part of one team.  This team includes your primary Cardiologist (physician) and Advanced Practice Providers or APPs (Physician Assistants and Nurse Practitioners) who all work together to provide you with the care you need, when you need it.  Your next appointment:   3 month(s)  Provider:   Jerel Balding, MD    We recommend signing up for the patient portal called MyChart.  Sign up information is provided  on this After Visit Summary.  MyChart is used to connect with patients for Virtual Visits (Telemedicine).  Patients are able to view lab/test results, encounter notes, upcoming appointments, etc.  Non-urgent messages can be sent to your provider as well.   To learn more about what you can do with MyChart, go to [email or web address].      Signed, Jerel Balding, MD  09/24/2024 3:53 PM    [facility]

## 2024-10-21 ENCOUNTER — Ambulatory Visit (HOSPITAL_COMMUNITY)

## 2024-10-27 ENCOUNTER — Telehealth (HOSPITAL_COMMUNITY): Payer: Self-pay | Admitting: Cardiovascular Disease

## 2024-10-27 NOTE — Telephone Encounter (Signed)
 Patient cancelled her scheduled echocardiogram. We have tried to reach patient to reschedule and patient has not called back. Order will be removed from the active echo WQ. If patient calls back to reschedule we will reinstate the order. Thank you.

## 2024-11-07 ENCOUNTER — Ambulatory Visit: Admitting: Cardiovascular Disease

## 2024-12-22 ENCOUNTER — Ambulatory Visit: Admitting: Cardiovascular Disease

## 2025-01-02 ENCOUNTER — Ambulatory Visit (HOSPITAL_COMMUNITY)

## 2025-02-16 ENCOUNTER — Ambulatory Visit: Admitting: Cardiovascular Disease

## 2025-03-09 ENCOUNTER — Encounter: Admitting: Internal Medicine

## 2025-03-09 ENCOUNTER — Ambulatory Visit
